# Patient Record
Sex: Female | Born: 1945 | Race: White | Hispanic: No | Marital: Married | Smoking: Never smoker
Health system: Southern US, Community
[De-identification: ages and names within clinical notes are randomized; demographics above are authoritative.]

## PROBLEM LIST (undated history)

## (undated) DIAGNOSIS — M797 Fibromyalgia: Secondary | ICD-10-CM

## (undated) DIAGNOSIS — M1990 Unspecified osteoarthritis, unspecified site: Secondary | ICD-10-CM

## (undated) DIAGNOSIS — G4700 Insomnia, unspecified: Secondary | ICD-10-CM

## (undated) DIAGNOSIS — E1140 Type 2 diabetes mellitus with diabetic neuropathy, unspecified: Secondary | ICD-10-CM

## (undated) DIAGNOSIS — E039 Hypothyroidism, unspecified: Secondary | ICD-10-CM

## (undated) DIAGNOSIS — E119 Type 2 diabetes mellitus without complications: Secondary | ICD-10-CM

## (undated) DIAGNOSIS — E079 Disorder of thyroid, unspecified: Secondary | ICD-10-CM

## (undated) HISTORY — PX: TONSILLECTOMY: SUR1361

## (undated) HISTORY — PX: WRIST SURGERY: SHX841

## (undated) HISTORY — PX: OTHER SURGICAL HISTORY: SHX169

## (undated) HISTORY — PX: APPENDECTOMY: SHX54

---

## 2014-11-29 ENCOUNTER — Emergency Department: Admit: 2014-11-29 | Payer: MEDICARE | Primary: Internal Medicine

## 2014-11-29 ENCOUNTER — Inpatient Hospital Stay
Admit: 2014-11-29 | Discharge: 2014-11-29 | Disposition: A | Discharge status: Home / Self Care | Payer: MEDICARE | Attending: Emergency Medicine

## 2014-11-29 DIAGNOSIS — M25511 Pain in right shoulder: Secondary | ICD-10-CM

## 2014-11-29 LAB — CBC WITH AUTO DIFFERENTIAL
Basophils %: 0.2 % (ref 0.0–1.0)
Basophils Absolute: 0 10*3/uL (ref 0.00–0.20)
Eosinophils %: 0.8 % (ref 0.0–5.0)
Eosinophils Absolute: 0.1 10*3/uL (ref 0.00–0.60)
Hematocrit: 43.4 % (ref 37.0–47.0)
Hemoglobin: 14.3 g/dL (ref 12.0–16.0)
Lymphocytes %: 24.8 % (ref 20.0–40.0)
Lymphocytes Absolute: 3.3 10*3/uL (ref 1.1–4.5)
MCH: 31.8 pg — ABNORMAL HIGH (ref 27.0–31.0)
MCHC: 32.9 g/dL — ABNORMAL LOW (ref 33.0–37.0)
MCV: 96.7 fL (ref 81.0–99.0)
MPV: 9.8 fL (ref 7.4–10.4)
Monocytes %: 10 % (ref 0.0–10.0)
Monocytes Absolute: 1.3 10*3/uL — ABNORMAL HIGH (ref 0.00–0.90)
Neutrophils %: 64.2 % (ref 50.0–65.0)
Neutrophils Absolute: 8.5 10*3/uL — ABNORMAL HIGH (ref 1.5–7.5)
Platelets: 352 10*3/uL (ref 130–400)
RBC: 4.49 M/uL (ref 4.20–5.40)
RDW: 12.8 % (ref 11.5–14.5)
WBC: 13.2 10*3/uL — ABNORMAL HIGH (ref 4.8–10.8)

## 2014-11-29 LAB — BASIC METABOLIC PANEL
Anion Gap: 15 mmol/L (ref 7–19)
BUN: 13 mg/dL (ref 8–23)
CO2: 30 mmol/L — ABNORMAL HIGH (ref 22–29)
Calcium: 10.6 mg/dL — ABNORMAL HIGH (ref 8.8–10.2)
Chloride: 86 mmol/L — ABNORMAL LOW (ref 98–111)
Creatinine: 0.6 mg/dL (ref 0.5–0.9)
GFR Non-African American: >60 (ref >60)
Glucose: 175 mg/dL — ABNORMAL HIGH (ref 74–109)
Potassium: 3.8 mmol/L (ref 3.5–5.0)
Sodium: 131 mmol/L — ABNORMAL LOW (ref 136–145)

## 2014-11-29 LAB — TROPONIN: Troponin: <0.01 ng/mL (ref 0.00–0.03)

## 2014-11-29 MED ORDER — HYDROMORPHONE HCL 1 MG/ML IJ SOLN
1 MG/ML | Freq: Once | INTRAMUSCULAR | Status: DC
Start: 2014-11-29 — End: 2014-11-29

## 2014-11-29 MED ORDER — ONDANSETRON HCL 4 MG/2ML IJ SOLN
4 MG/2ML | INTRAMUSCULAR | Status: AC
Start: 2014-11-29 — End: 2014-11-29
  Administered 2014-11-29: 20:00:00 4

## 2014-11-29 MED ORDER — METHOCARBAMOL 750 MG PO TABS
750 MG | ORAL_TABLET | Freq: Three times a day (TID) | ORAL | 0 refills | Status: AC
Start: 2014-11-29 — End: 2014-12-09

## 2014-11-29 MED ORDER — CYCLOBENZAPRINE HCL 10 MG PO TABS
10 MG | ORAL_TABLET | Freq: Three times a day (TID) | ORAL | 0 refills | Status: AC | PRN
Start: 2014-11-29 — End: ?

## 2014-11-29 MED ORDER — ONDANSETRON HCL 4 MG/2ML IJ SOLN
4 MG/2ML | Freq: Once | INTRAMUSCULAR | Status: DC
Start: 2014-11-29 — End: 2014-11-29

## 2014-11-29 MED ORDER — NAPROXEN 500 MG PO TABS
500 MG | ORAL_TABLET | Freq: Two times a day (BID) | ORAL | 0 refills | Status: AC
Start: 2014-11-29 — End: ?

## 2014-11-29 MED ORDER — HYDROMORPHONE HCL 1 MG/ML IJ SOLN
1 MG/ML | Freq: Once | INTRAMUSCULAR | Status: AC
Start: 2014-11-29 — End: 2014-11-29
  Administered 2014-11-29: 20:00:00 1 mg via INTRAVENOUS

## 2014-11-29 MED FILL — HYDROMORPHONE HCL 1 MG/ML IJ SOLN: 1 MG/ML | INTRAMUSCULAR | Qty: 1

## 2014-11-29 MED FILL — ONDANSETRON HCL 4 MG/2ML IJ SOLN: 4 MG/2ML | INTRAMUSCULAR | Qty: 2

## 2014-11-29 NOTE — ED Notes (Signed)
Call to lab to draw blood.     Marletta LorStacy M Young, RN  11/29/14 1435

## 2014-11-29 NOTE — ED Provider Notes (Signed)
[facility] EMERG[facility]Y DEPT  eMERGENCY dEPARTMENT eNCOUnter      Pt Name: Roberta Fernandez  MRN: [id number]  Birthdate [birth year]  Date of evaluation: 11/29/2014  Provider: Thurmon Fair, MD    CHIEF COMPLAINT       Chief Complaint   Patient presents with   ??? Shoulder Pain     onset several days ago.  denies any injury         HISTORY OF PRESENT ILLNESS   (Location/Symptom, Timing/Onset, Context/Setting, Quality, Duration, Modifying Factors, Severity)  Note limiting factors.   Roberta Fernandez is a 69 y.o. female who presents to the emergency department With right shoulder and arm pain. The patient states that it's been going on for the last few days. She describes as a sharp and stabbing pain. It is worse with movement. She says she also has pain in the middle of her back when she takes a deep breath. She says she has numbness in her right thumb when she straightens out her arm. The patient has a history of fibromyalgia, but she says this pain is different. She denies any injury. She's had no definite fever, but she says that when she was sleeping the other night she had to use a cover because she felt like she might have a temperature. Denies any history of any degenerative disc disease in her neck or any other neck injury. She says she does have pain when she turns her head to side to side. She's pulled on her shoulder when she turns her neck. The location of her pain depends on what part of her body is moving. She denies any shortness of breath or definite chest pain.no calf pain or leg swelling. No history of blood clots.    HPI    Nursing Notes were reviewed.    REVIEW OF SYSTEMS    (2-9 systems for level 4, 10 or more for level 5)     Review of Systems   Constitutional: Negative for chills and fever.   HENT: Negative for rhinorrhea and sore throat.    Respiratory: Negative for shortness of breath.    Cardiovascular: Negative for chest pain and leg swelling.   Gastrointestinal: Negative for abdominal pain, diarrhea, nausea  and vomiting.   Genitourinary: Negative for difficulty urinating.   Musculoskeletal: Positive for back pain. Negative for neck pain.        Right shoulder and arm pain   Skin: Negative for rash.   Neurological: Negative for weakness and headaches.   Psychiatric/Behavioral: Negative for confusion.       A complete review of systems was performed and is negative except as noted above in the HPI.       PAST MEDICAL HISTORY     Past Medical History   Diagnosis Date   ??? Anxiety    ??? Arthritis    ??? Chronic fatigue    ??? Diabetes mellitus (HCC)    ??? Fibromyalgia    ??? Hyperlipidemia    ??? Sleep apnea    ??? Thyroid disease          SURGICAL HISTORY     History reviewed. No pertinent past surgical history.      CURRENT MEDICATIONS       Previous Medications    ALENDRONATE (FOSAMAX) 70 MG TABLET    Take 70 mg by mouth every 7 days    ALPRAZOLAM (XANAX) 0.5 MG TABLET    Take 0.5 mg by mouth nightly as needed for Sleep  AMITRIPTYLINE (ELAVIL) 100 MG TABLET    Take 100 mg by mouth nightly    EXENATIDE (BYETTA) 10 MCG/0.04ML INJECTION    Inject 5 mcg into the skin 2 times daily (with meals)    FLUTICASONE (FLONASE) 50 MCG/ACT NASAL SPRAY    1 spray by Nasal route daily    LEVOTHYROXINE (SYNTHROID) 25 MCG TABLET    Take 25 mcg by mouth Daily    METFORMIN (GLUCOPHAGE) 500 MG TABLET    Take 500 mg by mouth 2 times daily (with meals)    OMEPRAZOLE (PRILOSEC) 20 MG CAPSULE    Take 20 mg by mouth daily    SIMVASTATIN (ZOCOR) 10 MG TABLET    Take 10 mg by mouth nightly    TRIAMTERENE-HYDROCHLOROTHIAZIDE (MAXZIDE-25) 37.5-25 MG PER TABLET    Take 1 tablet by mouth daily       ALLERGIES     Codeine; Levaquin [levofloxacin in d5w]; Other; Penicillins; and Sulfa antibiotics    FAMILY HISTORY     History reviewed. No pertinent family history.       SOCIAL HISTORY       Social History     Social History   ??? Marital status: Married     Spouse name: N/A   ??? Number of children: N/A   ??? Years of education: N/A     Social History Main Topics   ???  Smoking status: Never Smoker   ??? Smokeless tobacco: None   ??? Alcohol use Yes   ??? Drug use: No   ??? Sexual activity: Not Asked     Other Topics Concern   ??? None     Social History Narrative   ??? None       SCREENINGS             PHYSICAL EXAM    (up to 7 for level 4, 8 or more for level 5)   ED Triage Vitals   BP Temp Temp src Pulse Resp SpO2 Height Weight   11/29/14 1314 -- -- 11/29/14 1314 11/29/14 1314 11/29/14 1314 11/29/14 1250 11/29/14 1250   141/86   106 17 98 %  (1.549 m) 176 lb (79.8 kg)       Physical Exam   Constitutional: She is oriented to person, place, and time. She appears well-developed and well-nourished. No distress.   HENT:   Head: Normocephalic and atraumatic.   Eyes: Pupils are equal, round, and reactive to light.   Neck: Normal range of motion. Neck supple.   Cardiovascular: Normal rate, regular rhythm, normal heart sounds and intact distal pulses.    Pulmonary/Chest: Effort normal and breath sounds normal. No respiratory distress. She exhibits no tenderness.   Abdominal: Soft. Bowel sounds are normal. She exhibits no distension. There is no tenderness.   Musculoskeletal: Normal range of motion. She exhibits no edema.   reproduction of pain with palpation of right soft tissues of her neck and upper back and movement of arm   Neurological: She is alert and oriented to person, place, and time. No cranial nerve deficit. She exhibits normal muscle tone. Coordination normal.   Skin: Skin is warm and dry. No rash noted. She is not diaphoretic.   Psychiatric: She has a normal mood and affect. Her behavior is normal.       DIAGNOSTIC RESULTS     EKG: All EKG's are interpreted by the Emergency Department Physician who either signs or Co-signs this chart in the absence of a cardiologist.  sinus tach rate 109 nonspecific ST-T wave changes    RADIOLOGY:   Non-plain film images such as CT, Ultrasound and MRI are read by the radiologist. Plain radiographic images are visualized and preliminarily  interpreted by the emergency physician with the below findings:      Interpretation per the Radiologist below, if available at the time of this note:    XR Shoulder Right Standard   Final Result   Impression: No acute findings. Degenerative change.      Dictated on 11/29/2014 2:54 PM EST. Signed by Dr Donnalee Curry on   11/29/2014 2:56 PM EST   Signed by Dr Donnalee Curry  on 11/29/2014 13:56      XR Chest Standard TWO VW   Final Result   Impression:    No acute findings.      Dictated on 11/29/2014 2:53 PM EST. Signed by Dr Donnalee Curry on   11/29/2014 2:54 PM EST   Signed by Dr Donnalee Curry  on 11/29/2014 13:54            ED BEDSIDE ULTRASOUND:   Performed by ED Physician - none    LABS:  Labs Reviewed   CBC WITH AUTO DIFFERENTIAL - Abnormal; Notable for the following:     WBC 13.2 (*)     MCH 31.8 (*)     MCHC 32.9 (*)     Neutrophils Absolute 8.5 (*)     Monocytes Absolute 1.30 (*)     All other components within normal limits   BASIC METABOLIC PANEL - Abnormal; Notable for the following:     Sodium 131 (*)     Chloride 86 (*)     CO2 30 (*)     Glucose 175 (*)     Calcium 10.6 (*)     All other components within normal limits   TROPONIN   POCT TROPONIN       All other labs were within normal range or not returned as of this dictation.    EMERGENCY DEPARTMENT COURSE and DIFFERENTIAL DIAGNOSIS/MDM:   Vitals:    Vitals:    11/29/14 1250 11/29/14 1314   BP:  141/86   Pulse:  106   Resp:  17   SpO2:  98%   Weight: 176 lb (79.8 kg)    Height: 5\' 1"  (1.549 m)        MDM  I really feel like this a musculokeletal pain.  I considered cardiac etiology, pneumonia, ptx, pe in my differential. Pain is reproducible with movement and with palpation. Pt denies SOA, she just says her back hurts when she takes a deep breath, likely from chest wall expansion as lungs expand. She has no HO PE, no signs of DVT, and normal oxygen saturation   Pt's sodium level is slightly low. Pt is aware, states she drinks copious amounts of water.      CONSULTS:  None    PROCEDURES:  Unless otherwise noted below, none     Procedures    FINAL IMPRESSION      1. Acute pain of right shoulder    2. Hyponatremia          DISPOSITION/PLAN   DISPOSITION Decision to Discharge    PATIENT REFERRED TO:  Domingo Madeira  [address]  [address]  [place] [address]  [phone]    In 2 days        DISCHARGE MEDICATIONS:  New Prescriptions    NAPROXEN (NAPROSYN) 500 MG  TABLET    Take 1 tablet by mouth 2 times daily          (Please note that portions of this note were completed with a voice recognition program.  Efforts were made to edit the dictations but occasionally words are mis-transcribed.)    Thurmon Fair, MD (electronically signed)  Attending Emergency Physician         Thurmon Fair, MD  11/29/14 [phone]

## 2014-11-29 NOTE — Discharge Instructions (Signed)
Return    Musculoskeletal Pain: Care Instructions  Your Care Instructions  Different problems with the bones, muscles, nerves, ligaments, and tendons in the body can cause pain. One or more areas of your body may ache or burn. Or they may feel tired, stiff, or sore.  The medical term for this type of pain is musculoskeletal pain. It can have many different causes.  Sometimes the pain is caused by an injury such as a strain or sprain. Or you might have pain from using one part of your body in the same way over and over again. This is called overuse.  In some cases, the cause of the pain is another health problem such as arthritis or fibromyalgia.  The doctor will examine you and ask you questions about your health to help find the cause of your pain. Blood tests or imaging tests like an X-ray may also be helpful. But sometimes doctors can't find a cause of the pain.  Treatment depends on your symptoms and the cause of the pain, if known.  The doctor has checked you carefully, but problems can develop later. If you notice any problems or new symptoms, get medical treatment right away.  Follow-up care is a key part of your treatment and safety. Be sure to make and go to all appointments, and call your doctor if you are having problems. It's also a good idea to know your test results and keep a list of the medicines you take.  How can you care for yourself at home?   Rest until you feel better.   Do not do anything that makes the pain worse. Return to exercise gradually if you feel better and your doctor says it's okay.   Be safe with medicines. Read and follow all instructions on the label.   If the doctor gave you a prescription medicine for pain, take it as prescribed.   If you are not taking a prescription pain medicine, ask your doctor if you can take an over-the-counter medicine.   Put ice or a cold pack on the area for 10 to 20 minutes at a time to ease pain. Put a thin cloth between the ice and your  skin.  When should you call for help?  Call your doctor now or seek immediate medical care if:   You have new pain, or your pain gets worse.   You have new symptoms such as a fever, a rash, or chills.  Watch closely for changes in your health, and be sure to contact your doctor if:   You do not get better as expected.   Where can you learn more?   Go to [email or web address] and sign in to your MyChart account. Enter [phone] in the Search Health Information box to learn more about "Musculoskeletal Pain: Care Instructions."    If you do not have an account, please click on the "Sign Up Now" link.    2006-2016 [employer]. Care instructions adapted under license by [facility]. This care instruction is for use with your licensed healthcare professional. If you have questions about a medical condition or this instruction, always ask your healthcare professional. [employer] disclaims any warranty or liability for your use of this information.  Content Version: 10.8.513193; Current as of: February 28, 2014          Shoulder Pain: Care Instructions  Your Care Instructions     You can hurt your shoulder by using it too much during an activity, such as  fishing or baseball. It can also happen as part of the everyday wear and tear of getting older. Shoulder injuries can be slow to heal, but your shoulder should get better with time.  Your doctor may recommend a sling to rest your shoulder. If you have injured your shoulder, you may need testing and treatment.  Follow-up care is a key part of your treatment and safety. Be sure to make and go to all appointments, and call your doctor if you are having problems. It's also a good idea to know your test results and keep a list of the medicines you take.  How can you care for yourself at home?   Take pain medicines exactly as directed.   If the doctor gave you a prescription medicine for pain, take it as prescribed.   If you are not  taking a prescription pain medicine, ask your doctor if you can take an over-the-counter medicine.   Do not take two or more pain medicines at the same time unless the doctor told you to. Many pain medicines contain acetaminophen, which is Tylenol. Too much acetaminophen (Tylenol) can be harmful.   If your doctor recommends that you wear a sling, use it as directed. Do not take it off before your doctor tells you to.   Put ice or a cold pack on the sore area for 10 to 20 minutes at a time. Put a thin cloth between the ice and your skin.   If there is no swelling, you can put moist heat, a heating pad, or a warm cloth on your shoulder. Some doctors suggest alternating between hot and cold.   Rest your shoulder for a few days. If your doctor recommends it, you can then begin gentle exercise of the shoulder, but do not lift anything heavy.  When should you call for help?  Call 911 anytime you think you may need emergency care. For example, call if:   You have chest pain or pressure. This may occur with:   Sweating.   Shortness of breath.   Nausea or vomiting.   Pain that spreads from the chest to the neck, jaw, or one or both shoulders or arms.   Dizziness or lightheadedness.   A fast or uneven pulse.  After calling 911, chew 1 adult-strength aspirin. Wait for an ambulance. Do not try to drive yourself.   Your arm or hand is cool or pale or changes color.  Call your doctor now or seek immediate medical care if:   You have signs of infection, such as:   Increased pain, swelling, warmth, or redness in your shoulder.   Red streaks leading from a place on your shoulder.   Pus draining from an area of your shoulder.   Swollen lymph nodes in your neck, armpits, or groin.   A fever.  Watch closely for changes in your health, and be sure to contact your doctor if:   You cannot use your shoulder.   Your shoulder does not get better as expected.   Where can you learn more?   Go to  [email or web address] and sign in to your MyChart account. Enter [phone] in the Search Health Information box to learn more about "Shoulder Pain: Care Instructions."    If you do not have an account, please click on the "Sign Up Now" link.    2006-2016 [employer]. Care instructions adapted under license by [facility]. This care instruction is for use with your licensed healthcare professional. If you  have questions about a medical condition or this instruction, always ask your healthcare professional. [employer] disclaims any warranty or liability for your use of this information.  Content Version: 10.8.513193; Current as of: Aug 30, 2013       to ER for worsening chest pain, shortness of breath

## 2014-12-17 NOTE — Procedures (Signed)
EKG    DATE OF PROCEDURE:  11/29/2014      Time: 1256 hours     Leftward axis.  No prior tracing is available for comparison.         ________________________________  Shela Commons. Despina Pole, MD    [phone]  DD: 12/11/2014 16:14  DT: 12/17/2014 09:50  [facility] File#: [id number]  Job #:[id number] /  [id number]    cc:

## 2016-11-10 DIAGNOSIS — E039 Hypothyroidism, unspecified: Secondary | ICD-10-CM | POA: Insufficient documentation

## 2016-11-10 DIAGNOSIS — M1990 Unspecified osteoarthritis, unspecified site: Secondary | ICD-10-CM | POA: Insufficient documentation

## 2016-11-10 DIAGNOSIS — F419 Anxiety disorder, unspecified: Secondary | ICD-10-CM | POA: Insufficient documentation

## 2016-11-10 DIAGNOSIS — M797 Fibromyalgia: Secondary | ICD-10-CM | POA: Insufficient documentation

## 2016-11-10 DIAGNOSIS — K219 Gastro-esophageal reflux disease without esophagitis: Secondary | ICD-10-CM | POA: Insufficient documentation

## 2018-05-28 DIAGNOSIS — E7800 Pure hypercholesterolemia, unspecified: Secondary | ICD-10-CM | POA: Insufficient documentation

## 2018-05-28 DIAGNOSIS — F909 Attention-deficit hyperactivity disorder, unspecified type: Secondary | ICD-10-CM | POA: Insufficient documentation

## 2018-09-24 DIAGNOSIS — E1142 Type 2 diabetes mellitus with diabetic polyneuropathy: Secondary | ICD-10-CM | POA: Insufficient documentation

## 2019-06-23 ENCOUNTER — Emergency Department: Payer: Medicare Other

## 2019-06-23 ENCOUNTER — Encounter: Payer: Self-pay | Admitting: Emergency Medicine

## 2019-06-23 ENCOUNTER — Other Ambulatory Visit: Payer: Self-pay

## 2019-06-23 ENCOUNTER — Emergency Department
Admission: EM | Admit: 2019-06-23 | Discharge: 2019-06-23 | Disposition: A | Discharge status: Home / Self Care | Payer: Medicare Other | Attending: Emergency Medicine | Admitting: Emergency Medicine

## 2019-06-23 DIAGNOSIS — E119 Type 2 diabetes mellitus without complications: Secondary | ICD-10-CM | POA: Diagnosis not present

## 2019-06-23 DIAGNOSIS — R42 Dizziness and giddiness: Secondary | ICD-10-CM | POA: Insufficient documentation

## 2019-06-23 DIAGNOSIS — R531 Weakness: Secondary | ICD-10-CM | POA: Diagnosis not present

## 2019-06-23 HISTORY — DX: Disorder of thyroid, unspecified: E07.9

## 2019-06-23 HISTORY — DX: Unspecified osteoarthritis, unspecified site: M19.90

## 2019-06-23 HISTORY — DX: Type 2 diabetes mellitus without complications: E11.9

## 2019-06-23 LAB — BASIC METABOLIC PANEL
Anion gap: 11 (ref 5–15)
BUN: 21 mg/dL (ref 8–23)
CO2: 28 mmol/L (ref 22–32)
Calcium: 9.9 mg/dL (ref 8.9–10.3)
Chloride: 94 mmol/L — ABNORMAL LOW (ref 98–111)
Creatinine, Ser: 0.94 mg/dL (ref 0.44–1.00)
GFR calc Af Amer: >60 mL/min (ref >60)
GFR calc non Af Amer: 60 mL/min — ABNORMAL LOW (ref >60)
Glucose, Bld: 184 mg/dL — ABNORMAL HIGH (ref 70–99)
Potassium: 4.3 mmol/L (ref 3.5–5.1)
Sodium: 133 mmol/L — ABNORMAL LOW (ref 135–145)

## 2019-06-23 LAB — URINALYSIS, COMPLETE (UACMP) WITH MICROSCOPIC
Bacteria, UA: NONE SEEN
Bilirubin Urine: NEGATIVE
Glucose, UA: NEGATIVE mg/dL
Hgb urine dipstick: NEGATIVE
Ketones, ur: NEGATIVE mg/dL
Leukocytes,Ua: NEGATIVE
Nitrite: NEGATIVE
Protein, ur: NEGATIVE mg/dL
Specific Gravity, Urine: 1.003 — ABNORMAL LOW (ref 1.005–1.030)
Squamous Epithelial / HPF: NONE SEEN (ref 0–5)
pH: 8 (ref 5.0–8.0)

## 2019-06-23 LAB — CBC
HCT: 43.2 % (ref 36.0–46.0)
Hemoglobin: 13.8 g/dL (ref 12.0–15.0)
MCH: 30.1 pg (ref 26.0–34.0)
MCHC: 31.9 g/dL (ref 30.0–36.0)
MCV: 94.3 fL (ref 80.0–100.0)
Platelets: 342 10*3/uL (ref 150–400)
RBC: 4.58 MIL/uL (ref 3.87–5.11)
RDW: 12.3 % (ref 11.5–15.5)
WBC: 11 10*3/uL — ABNORMAL HIGH (ref 4.0–10.5)
nRBC: 0 % (ref 0.0–0.2)

## 2019-06-23 LAB — TSH: TSH: 0.8 u[IU]/mL (ref 0.350–4.500)

## 2019-06-23 MED ORDER — LORAZEPAM 2 MG/ML IJ SOLN
1.0000 mg | Freq: Once | INTRAMUSCULAR | Status: AC
  Administered 2019-06-23: 1 mg via INTRAVENOUS
  Filled 2019-06-23: qty 1

## 2019-06-23 NOTE — Discharge Instructions (Addendum)
I suspect your symptoms are originating from your inner ear.  Considerations includes:  Vestibular neuropathy Diabetic neuropathy causing above Possible relation to your chronic sinusitis with middle ear effusions Meniere's disease Less likely BPPV (benign positional paroxysmal vertigo)  I'd recommend following up with an ENT specialist (ideally middle/inner ear) in the next 1-2 weeks  Try to take an extra valium with symptom onset to see if it helps  Try sinus rinses as well if able

## 2019-06-23 NOTE — ED Triage Notes (Signed)
Pt arrived via POV from [facility], pt states she was sent here for possible stroke sxs.  Pt reports of "profound dry mouth" since Wednesday, causing difficulty speaking.  Pt also c/o dizziness when turning her head.  Pt states she was told that she had a rare condition they way her eyes moved.   Pt was told CBG was 201 at [facility].  Pt recently started on Trulicity.

## 2019-06-23 NOTE — ED Notes (Signed)
Pt taken to mri via mri tech

## 2019-06-23 NOTE — ED Provider Notes (Signed)
[facility] Emergency Department Provider Note  ____________________________________________   First MD Initiated Contact with Patient 06/23/19 1035     (approximate)  I have reviewed the triage vital signs and the nursing notes.   HISTORY  Chief Complaint Weakness    HPI Ruth Stout is a 74 y.o. female  Here with multiple issues. Primary complaint is dry mouth, loss of balance, and intermittent dizziness. This is an ongoing, chronic issue for months to years. She has had extensive work-ups for this. She states that she is here today because over the past week she has felt like she has had more frequency episodes, that she had an episode of "leaning left," and loss of balance last week. She has had some dry mouth, fatigue, and polyuria with this as well. Her sugars have been slightly but not severely elevated. 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 No headache. No fever, chills. No recent medication changes. No tinnitus.       Past Medical History:  Diagnosis Date  . Arthritis   . Diabetes mellitus without complication ([place])   . Thyroid disease     There are no problems to display for this patient.   History reviewed. No pertinent surgical history.  Prior to Admission medications   Not on File    Allergies Cephalexin, Codeine, Levofloxacin in d5w, Other, Penicillins, and Sulfa antibiotics  No family history on file.  Social History Social History   Tobacco Use  . Smoking status: Never Smoker  . Smokeless tobacco: Never Used  Substance Use Topics  . Alcohol use: Not on file  . Drug use: Not on file    Review of Systems  Review of Systems  Constitutional: Positive for fatigue. Negative for fever.  HENT: Negative for congestion and sore throat.   Eyes: Negative for visual disturbance.    Respiratory: Negative for cough and shortness of breath.   Cardiovascular: Negative for chest pain.  Gastrointestinal: Negative for abdominal pain, diarrhea, nausea and vomiting.  Endocrine: Positive for polydipsia and polyuria.  Genitourinary: Negative for flank pain.  Musculoskeletal: Positive for gait problem. Negative for back pain and neck pain.  Skin: Negative for rash and wound.  Neurological: Positive for dizziness. Negative for weakness.  All other systems reviewed and are negative.    ____________________________________________  PHYSICAL EXAM:      VITAL SIGNS: ED Triage Vitals [06/23/19 1009]  Enc Vitals Group     BP 138/71     Pulse Rate 89     Resp 18     Temp 98.8 F (37.1 C)     Temp Source Oral     SpO2 97 %     Weight 180 lb (81.6 kg)     Height 5' 1"  (1.549 m)     Head Circumference      Peak Flow      Pain Score 0     Pain Loc      Pain Edu?      Excl. in [place]?      Physical Exam Vitals and nursing note reviewed.  Constitutional:      General: She is not in acute distress.    Appearance: She is well-developed.  HENT:     Head: Normocephalic and atraumatic.     Comments: Moderate serous effusions bilaterally.  No TM erythema.    Mouth/Throat:     Mouth: Mucous membranes are dry.  Eyes:     Conjunctiva/sclera: Conjunctivae normal.  Cardiovascular:     Rate and Rhythm: Normal rate and regular rhythm.     Heart sounds: Normal heart sounds.  Pulmonary:     Effort: Pulmonary effort is normal. No respiratory distress.     Breath sounds: No wheezing.  Abdominal:     General: There is no distension.  Musculoskeletal:     Cervical back: Neck supple.  Skin:    General: Skin is warm.     Capillary Refill: Capillary refill takes less than 2 seconds.     Findings: No rash.  Neurological:     Mental Status: She is alert and oriented to person, place, and time.     Motor: No abnormal muscle tone.     Comments: Neurological Exam:  Mental Status:  Alert and oriented to person, place, and time. Attention and concentration normal. Speech clear. Recent memory is intact. Cranial Nerves: Visual fields grossly intact. EOMI and PERRLA. No nystagmus noted. Facial sensation intact at forehead, maxillary cheek, and chin/mandible bilaterally. No facial asymmetry or weakness. Hearing grossly normal. Uvula is midline, and palate elevates symmetrically. Normal SCM and trapezius strength. Tongue midline without fasciculations. Motor: Muscle strength 5/5 in proximal and distal UE and LE bilaterally. No pronator drift. Muscle tone normal.  Sensation: Intact to light touch in upper and lower extremities distally bilaterally.  Gait: Normal without ataxia. Coordination: Normal FTN bilaterally.          ____________________________________________   LABS (all labs ordered are listed, but only abnormal results are displayed)  Labs Reviewed  BASIC METABOLIC PANEL - Abnormal; Notable for the following components:      Result Value   Sodium 133 (*)    Chloride 94 (*)    Glucose, Bld 184 (*)    GFR calc non Af Amer 60 (*)    All other components within normal limits  CBC - Abnormal; Notable for the following components:   WBC 11.0 (*)    All other components within normal limits  URINALYSIS, COMPLETE (UACMP) WITH MICROSCOPIC - Abnormal; Notable for the following components:   Color, Urine STRAW (*)    APPearance CLEAR (*)    Specific Gravity, Urine 1.003 (*)    All other components within normal limits  TSH  BASIC METABOLIC PANEL    ____________________________________________  EKG: None ________________________________________  RADIOLOGY All imaging, including plain films, CT scans, and ultrasounds, independently reviewed by me, and interpretations confirmed via formal radiology reads.  ED MD interpretation:   MRI: No acute abnormality or mass  Official radiology report(s): MR BRAIN WO CONTRAST  Result Date: 06/23/2019 CLINICAL  DATA:  Acute stroke suspected. Dry mouth since Wednesday with difficulty speaking. Dizziness when turning head. EXAM: MRI HEAD WITHOUT CONTRAST TECHNIQUE: Multiplanar, multiecho pulse sequences of the brain and surrounding structures were obtained without intravenous contrast. COMPARISON:  None. FINDINGS: Brain: Chronic small vessel ischemic type change in the cerebral white matter to a mild degree. Age normal brain volume. No acute infarct, hemorrhage, hydrocephalus, or masslike finding. Vascular: Normal flow voids. Skull and upper cervical spine: Normal marrow signal. Cervical spine degeneration. Sinuses/Orbits: Mucosal thickening in the anterior ethmoid and inferior frontal sinuses, incidental to the history. Mastoid and middle ear spaces are clear. Negative orbits. IMPRESSION: 1. No acute finding, including infarct. 2. Senescent changes in keeping with age. 3. Mucosal thickening in the frontal and  ethmoid sinuses. Electronically Signed   By: Monte Fantasia M.D.   On: 06/23/2019 13:10    ____________________________________________  PROCEDURES   Procedure(s) performed (including Critical Care):  Procedures  ____________________________________________  INITIAL IMPRESSION / MDM / [place] / ED COURSE  As part of my medical decision making, I reviewed the following data within the [facility] notes reviewed and incorporated, Old chart reviewed, Notes from prior ED visits, and [place] Controlled Substance Database       *GREYSON RICCARDI was evaluated in Emergency Department on 06/23/2019 for the symptoms described in the history of present illness. She was evaluated in the context of the global COVID-19 pandemic, which necessitated consideration that the patient might be at risk for infection with the SARS-CoV-2 virus that causes COVID-19. Institutional protocols and algorithms that pertain to the evaluation of patients at risk for COVID-19 are in a state of rapid  change based on information released by regulatory bodies including the CDC and federal and state organizations. These policies and algorithms were followed during the patient's care in the ED.  Some ED evaluations and interventions may be delayed as a result of limited staffing during the pandemic.*     Medical Decision Making:  74 yo F here with intermittent dizziness, loss of balance. This seems to be an ongoing issue, but given her age must consider CVA. MRI obtained which is fortunately negative. No signs of pituitary or other mass/lesion. Labs overall reassuring. She has an extensive h/o middle and inner ear issues and I suspect this is contributing to her sx. She c/o dry mouth as well and has h/o poorly controlled DM. No signs of DKA and she is tolerating PO, but she very well could have a diabetic neuropathy /vestibulopathy as well. WIll refer her to ENT. No apparent emergent etiology.  ____________________________________________  FINAL CLINICAL IMPRESSION(S) / ED DIAGNOSES  Final diagnoses:  Vertigo     MEDICATIONS GIVEN DURING THIS VISIT:  Medications  LORazepam (ATIVAN) injection 1 mg (1 mg Intravenous Given 06/23/19 1223)     ED Discharge Orders    None       Note:  This document was prepared using Dragon voice recognition software and may include unintentional dictation errors.   Duffy Bruce, MD 06/23/19 1757

## 2020-05-02 ENCOUNTER — Emergency Department
Admission: EM | Admit: 2020-05-02 | Discharge: 2020-05-02 | Disposition: A | Discharge status: Home / Self Care | Payer: Medicare Other | Attending: Emergency Medicine | Admitting: Emergency Medicine

## 2020-05-02 ENCOUNTER — Encounter: Payer: Self-pay | Admitting: Intensive Care

## 2020-05-02 ENCOUNTER — Emergency Department: Payer: Medicare Other

## 2020-05-02 ENCOUNTER — Other Ambulatory Visit: Payer: Self-pay

## 2020-05-02 DIAGNOSIS — R600 Localized edema: Secondary | ICD-10-CM | POA: Insufficient documentation

## 2020-05-02 DIAGNOSIS — Z5321 Procedure and treatment not carried out due to patient leaving prior to being seen by health care provider: Secondary | ICD-10-CM | POA: Diagnosis not present

## 2020-05-02 DIAGNOSIS — M79672 Pain in left foot: Secondary | ICD-10-CM | POA: Diagnosis not present

## 2020-05-02 DIAGNOSIS — E119 Type 2 diabetes mellitus without complications: Secondary | ICD-10-CM | POA: Diagnosis not present

## 2020-05-02 HISTORY — DX: Hypothyroidism, unspecified: E03.9

## 2020-05-02 HISTORY — DX: Insomnia, unspecified: G47.00

## 2020-05-02 HISTORY — DX: Fibromyalgia: M79.7

## 2020-05-02 HISTORY — DX: Type 2 diabetes mellitus with diabetic neuropathy, unspecified: E11.40

## 2020-05-02 LAB — CBC WITH DIFFERENTIAL/PLATELET
Abs Immature Granulocytes: 0.07 10*3/uL (ref 0.00–0.07)
Basophils Absolute: 0 10*3/uL (ref 0.0–0.1)
Basophils Relative: 0 %
Eosinophils Absolute: 0.1 10*3/uL (ref 0.0–0.5)
Eosinophils Relative: 1 %
HCT: 33.6 % — ABNORMAL LOW (ref 36.0–46.0)
Hemoglobin: 11.2 g/dL — ABNORMAL LOW (ref 12.0–15.0)
Immature Granulocytes: 1 %
Lymphocytes Relative: 33 %
Lymphs Abs: 4.5 10*3/uL — ABNORMAL HIGH (ref 0.7–4.0)
MCH: 30.4 pg (ref 26.0–34.0)
MCHC: 33.3 g/dL (ref 30.0–36.0)
MCV: 91.3 fL (ref 80.0–100.0)
Monocytes Absolute: 0.9 10*3/uL (ref 0.1–1.0)
Monocytes Relative: 7 %
Neutro Abs: 8.1 10*3/uL — ABNORMAL HIGH (ref 1.7–7.7)
Neutrophils Relative %: 58 %
Platelets: 349 10*3/uL (ref 150–400)
RBC: 3.68 MIL/uL — ABNORMAL LOW (ref 3.87–5.11)
RDW: 12 % (ref 11.5–15.5)
WBC: 13.7 10*3/uL — ABNORMAL HIGH (ref 4.0–10.5)
nRBC: 0 % (ref 0.0–0.2)

## 2020-05-02 LAB — COMPREHENSIVE METABOLIC PANEL
ALT: 14 U/L (ref 0–44)
AST: 18 U/L (ref 15–41)
Albumin: 3.3 g/dL — ABNORMAL LOW (ref 3.5–5.0)
Alkaline Phosphatase: 63 U/L (ref 38–126)
Anion gap: 10 (ref 5–15)
BUN: 16 mg/dL (ref 8–23)
CO2: 27 mmol/L (ref 22–32)
Calcium: 9 mg/dL (ref 8.9–10.3)
Chloride: 94 mmol/L — ABNORMAL LOW (ref 98–111)
Creatinine, Ser: 0.84 mg/dL (ref 0.44–1.00)
GFR, Estimated: >60 mL/min (ref >60)
Glucose, Bld: 327 mg/dL — ABNORMAL HIGH (ref 70–99)
Potassium: 3.4 mmol/L — ABNORMAL LOW (ref 3.5–5.1)
Sodium: 131 mmol/L — ABNORMAL LOW (ref 135–145)
Total Bilirubin: 0.6 mg/dL (ref 0.3–1.2)
Total Protein: 7.3 g/dL (ref 6.5–8.1)

## 2020-05-02 NOTE — ED Triage Notes (Signed)
Patient presents with left foot/calf swelling and pain X2 days. HX diabetes. No open wounds. No injury. Denies taking blood thinners

## 2020-05-05 ENCOUNTER — Emergency Department
Admission: EM | Admit: 2020-05-05 | Discharge: 2020-05-05 | Disposition: A | Discharge status: Home / Self Care | Payer: Medicare Other | Attending: Emergency Medicine | Admitting: Emergency Medicine

## 2020-05-05 ENCOUNTER — Emergency Department: Payer: Medicare Other

## 2020-05-05 DIAGNOSIS — R609 Edema, unspecified: Secondary | ICD-10-CM

## 2020-05-05 DIAGNOSIS — R600 Localized edema: Secondary | ICD-10-CM | POA: Diagnosis not present

## 2020-05-05 DIAGNOSIS — E1140 Type 2 diabetes mellitus with diabetic neuropathy, unspecified: Secondary | ICD-10-CM | POA: Insufficient documentation

## 2020-05-05 DIAGNOSIS — E039 Hypothyroidism, unspecified: Secondary | ICD-10-CM | POA: Insufficient documentation

## 2020-05-05 DIAGNOSIS — M79672 Pain in left foot: Secondary | ICD-10-CM | POA: Diagnosis present

## 2020-05-05 NOTE — ED Provider Notes (Signed)
[facility] Emergency Department Provider Note ____________________________________________  Time seen: Approximately 5:09 PM  I have reviewed the triage vital signs and the nursing notes.   HISTORY  Chief Complaint left foot swollen    HPI Ruth Stout is a 75 y.o. female who presents to the emergency department for evaluation and treatment of left foot pain and swelling.  Patient denies injury.  Symptoms started 4 days ago.  Pain increases with ambulation.  No alleviating measures attempted prior to arrival.   Past Medical History:  Diagnosis Date  . Arthritis   . Diabetes mellitus without complication ([place])   . Diabetic neuropathy ([place])   . Fibromyalgia   . Hypothyroid   . Insomnia   . Thyroid disease     There are no problems to display for this patient.   History reviewed. No pertinent surgical history.  Prior to Admission medications   Not on File    Allergies Cephalexin, Codeine, Levofloxacin in d5w, Other, Penicillins, and Sulfa antibiotics  No family history on file.  Social History Social History   Tobacco Use  . Smoking status: Never Smoker  . Smokeless tobacco: Never Used  Vaping Use  . Vaping Use: Never used  Substance Use Topics  . Alcohol use: Yes    Comment: occ  . Drug use: Yes    Comment: prescribed oxy    Review of Systems Constitutional: Negative for fever. Cardiovascular: Negative for chest pain. Respiratory: Negative for shortness of breath. Musculoskeletal: Positive for left foot pain. Skin: Positive for left foot swelling. Neurological: Negative for decrease in sensation  ____________________________________________   PHYSICAL EXAM:  VITAL SIGNS: ED Triage Vitals  Enc Vitals Group     BP 05/05/20 1624 117/66     Pulse Rate 05/05/20 1624 67     Resp 05/05/20 1624 18     Temp 05/05/20 1624 98 F (36.7 C)     Temp src --      SpO2 05/05/20 1624 100 %     Weight 05/05/20 1623 173 lb (78.5 kg)      Height 05/05/20 1623 5' 2"  (1.575 m)     Head Circumference --      Peak Flow --      Pain Score 05/05/20 1623 8     Pain Loc --      Pain Edu? --      Excl. in [place]? --     Constitutional: Alert and oriented. Well appearing and in no acute distress. Eyes: Conjunctivae are clear without discharge or drainage Head: Atraumatic Neck: Supple. Respiratory: No cough. Respirations are even and unlabored. Cardiovascular: DP and PT pulse 2+ in bilateral lower extremities.  1+ pitting edema noted in the left foot and ankle. Musculoskeletal: Able to demonstrate range of motion of the left foot and ankle. Neurologic: Motor and sensory function is intact. Skin: No erythema or increased skin temperature overlying the left foot or ankle Psychiatric: Affect and behavior are appropriate.  ____________________________________________   LABS (all labs ordered are listed, but only abnormal results are displayed)  Labs Reviewed - No data to display ____________________________________________  RADIOLOGY  Image of the left foot is negative for acute findings.  I, Sherrie George, personally viewed and evaluated these images (plain radiographs) as part of my medical decision making, as well as reviewing the written report by the radiologist.  DG Foot Complete Left  Result Date: 05/05/2020 CLINICAL DATA:  Pain and swelling EXAM: LEFT FOOT - COMPLETE 3+ VIEW COMPARISON:  None. FINDINGS: No fracture or malalignment. Small plantar calcaneal spur. Diffuse soft tissue swelling. Joint spaces appear maintained IMPRESSION: No acute osseous abnormality. Electronically Signed   By: Donavan Foil M.D.   On: 05/05/2020 17:48   ____________________________________________   PROCEDURES  Procedures  ____________________________________________   INITIAL IMPRESSION / ASSESSMENT AND PLAN / ED COURSE  Ruth Stout is a 75 y.o. who presents to the emergency department for treatment and evaluation of left  foot swelling.  See HPI for further details.  Exam is reassuring.  No indication of cellulitis or DVT.  She does have some 1+ pitting edema.  She was encouraged to decrease her sodium intake and wear compression stockings.  She was encouraged to see primary care for symptoms that do not improve with rest, elevation, compression, and sodium reduction.  She was encouraged to return to the emergency department if she develops chest pain, shortness of breath, or other concerns if she is unable to see primary care.  Medications - No data to display  Pertinent labs & imaging results that were available during my care of the patient were reviewed by me and considered in my medical decision making (see chart for details).   _________________________________________   FINAL CLINICAL IMPRESSION(S) / ED DIAGNOSES  Final diagnoses:  Peripheral edema    ED Discharge Orders         Ordered    Compression stockings        05/05/20 1756           If controlled substance prescribed during this visit, 12 month history viewed on the [place] prior to issuing an initial prescription for Schedule II or III opiod.   Victorino Dike, FNP 05/05/20 Kathlyn Sacramento, MD 05/05/20 2223

## 2020-05-05 NOTE — ED Triage Notes (Signed)
Pt comes with c/o left foot pain and swelling. Pt states this started last Thursday. Pt states pain when trying to ambulate. Pt states pain to touch as well.

## 2020-05-05 NOTE — Discharge Instructions (Signed)
Please decrease your sodium intake.  Elevate your feet off and on throughout the day.  Wear your compression stockings when out of bed.  Follow up with primary care when possible.   Return to the ER if you develop shortness of breath or if swelling/pain worsens.

## 2020-05-19 ENCOUNTER — Telehealth: Payer: Self-pay

## 2020-05-19 NOTE — Telephone Encounter (Signed)
Copied from [place] [id number]. Topic: General - Other >> May 19, 2020  1:11 PM Pawlus, Brayton Layman A wrote: PT would like the new patient paperwork sent over prior to her appt, states she has not gotten anything yet. PT also stated she wanted to know who Dr Army Melia usually refers patients too regarding pain management. Please advise.

## 2020-05-19 NOTE — Telephone Encounter (Signed)
Left message on voicemail to inform patient.

## 2020-06-16 ENCOUNTER — Encounter: Payer: Self-pay | Admitting: *Deleted

## 2020-06-29 ENCOUNTER — Encounter: Payer: Self-pay | Admitting: *Deleted

## 2020-07-14 ENCOUNTER — Ambulatory Visit: Payer: Self-pay | Admitting: Internal Medicine

## 2020-07-17 ENCOUNTER — Encounter: Payer: Self-pay | Admitting: Student in an Organized Health Care Education/Training Program

## 2020-07-28 ENCOUNTER — Ambulatory Visit: Payer: Medicare Other | Admitting: Student in an Organized Health Care Education/Training Program

## 2020-08-19 ENCOUNTER — Other Ambulatory Visit (HOSPITAL_COMMUNITY): Payer: Self-pay | Admitting: Physical Medicine & Rehabilitation

## 2020-08-19 ENCOUNTER — Other Ambulatory Visit: Payer: Self-pay | Admitting: Physical Medicine & Rehabilitation

## 2020-08-19 DIAGNOSIS — M5441 Lumbago with sciatica, right side: Secondary | ICD-10-CM

## 2020-08-19 DIAGNOSIS — M5442 Lumbago with sciatica, left side: Secondary | ICD-10-CM

## 2020-08-31 ENCOUNTER — Ambulatory Visit
Admission: RE | Admit: 2020-08-31 | Discharge: 2020-08-31 | Disposition: A | Discharge status: Home / Self Care | Payer: Medicare Other | Source: Ambulatory Visit | Attending: Physical Medicine & Rehabilitation | Admitting: Physical Medicine & Rehabilitation

## 2020-08-31 ENCOUNTER — Other Ambulatory Visit: Payer: Self-pay

## 2020-08-31 DIAGNOSIS — M5441 Lumbago with sciatica, right side: Secondary | ICD-10-CM | POA: Insufficient documentation

## 2020-08-31 DIAGNOSIS — M5442 Lumbago with sciatica, left side: Secondary | ICD-10-CM

## 2020-09-01 ENCOUNTER — Other Ambulatory Visit: Payer: Self-pay | Admitting: Physical Medicine & Rehabilitation

## 2020-09-01 DIAGNOSIS — M542 Cervicalgia: Secondary | ICD-10-CM

## 2020-09-12 ENCOUNTER — Other Ambulatory Visit: Payer: Self-pay

## 2020-09-12 ENCOUNTER — Ambulatory Visit
Admission: RE | Admit: 2020-09-12 | Discharge: 2020-09-12 | Disposition: A | Discharge status: Home / Self Care | Payer: Medicare Other | Source: Ambulatory Visit | Attending: Physical Medicine & Rehabilitation | Admitting: Physical Medicine & Rehabilitation

## 2020-09-12 DIAGNOSIS — M542 Cervicalgia: Secondary | ICD-10-CM | POA: Insufficient documentation

## 2021-01-22 ENCOUNTER — Other Ambulatory Visit: Payer: Self-pay | Admitting: Internal Medicine

## 2021-01-22 DIAGNOSIS — I208 Other forms of angina pectoris: Secondary | ICD-10-CM

## 2021-01-28 ENCOUNTER — Emergency Department (HOSPITAL_COMMUNITY): Payer: Medicare Other

## 2021-01-28 ENCOUNTER — Emergency Department (HOSPITAL_COMMUNITY)
Admission: EM | Admit: 2021-01-28 | Discharge: 2021-01-29 | Disposition: A | Discharge status: Home / Self Care | Payer: Medicare Other | Attending: Emergency Medicine | Admitting: Emergency Medicine

## 2021-01-28 ENCOUNTER — Other Ambulatory Visit: Payer: Self-pay

## 2021-01-28 ENCOUNTER — Encounter (HOSPITAL_COMMUNITY): Payer: Self-pay

## 2021-01-28 DIAGNOSIS — S4991XA Unspecified injury of right shoulder and upper arm, initial encounter: Secondary | ICD-10-CM | POA: Diagnosis present

## 2021-01-28 DIAGNOSIS — W010XXA Fall on same level from slipping, tripping and stumbling without subsequent striking against object, initial encounter: Secondary | ICD-10-CM | POA: Diagnosis not present

## 2021-01-28 DIAGNOSIS — Y92009 Unspecified place in unspecified non-institutional (private) residence as the place of occurrence of the external cause: Secondary | ICD-10-CM | POA: Insufficient documentation

## 2021-01-28 DIAGNOSIS — E039 Hypothyroidism, unspecified: Secondary | ICD-10-CM | POA: Insufficient documentation

## 2021-01-28 DIAGNOSIS — E119 Type 2 diabetes mellitus without complications: Secondary | ICD-10-CM | POA: Insufficient documentation

## 2021-01-28 DIAGNOSIS — S43014A Anterior dislocation of right humerus, initial encounter: Secondary | ICD-10-CM | POA: Diagnosis not present

## 2021-01-28 DIAGNOSIS — S43004A Unspecified dislocation of right shoulder joint, initial encounter: Secondary | ICD-10-CM

## 2021-01-28 MED ORDER — HYDROMORPHONE HCL 2 MG PO TABS: 2.0000 mg | ORAL_TABLET | Freq: Four times a day (QID) | ORAL | 0 refills | Status: AC | PRN

## 2021-01-28 MED ORDER — PROPOFOL 10 MG/ML IV BOLUS
1.0000 mg/kg | Freq: Once | INTRAVENOUS | Status: AC
  Administered 2021-01-28: 80 mg via INTRAVENOUS
  Filled 2021-01-28: qty 20

## 2021-01-28 MED ORDER — FENTANYL CITRATE PF 50 MCG/ML IJ SOSY
50.0000 ug | PREFILLED_SYRINGE | Freq: Once | INTRAMUSCULAR | Status: AC
  Administered 2021-01-28: 50 ug via INTRAVENOUS
  Filled 2021-01-28: qty 1

## 2021-01-28 NOTE — ED Provider Notes (Signed)
[facility]ER[facility] DEPARTMENT Provider Note   CSN: [id number] Arrival date & time: 01/28/21  1921     History No chief complaint on file.   Ruth Stout is a 75 y.o. female.  Presenting to the emergency room with concern for fall.  Patient states that she tripped over a box at home and landed on her right shoulder.  Thinks that she also hit her head.  Did not lose consciousness.  Is only having pain in her right shoulder.  Does endorse slight tingling sensation in some of her fingers.  Has been ambulatory since accident.  Not on blood thinners.  Former Therapist, sports.  Has history of diabetes, neuropathy, fibromyalgia.  HPI     Past Medical History:  Diagnosis Date   Arthritis    Diabetes mellitus without complication (HCC)    Diabetic neuropathy ([place])    Fibromyalgia    Hypothyroid    Insomnia    Thyroid disease     There are no problems to display for this patient.   History reviewed. No pertinent surgical history.   OB History   No obstetric history on file.     History reviewed. No pertinent family history.  Social History   Tobacco Use   Smoking status: Never   Smokeless tobacco: Never  Vaping Use   Vaping Use: Never used  Substance Use Topics   Alcohol use: Yes    Comment: occ   Drug use: Yes    Comment: prescribed oxy    Home Medications Prior to Admission medications   Medication Sig Start Date End Date Taking? Authorizing Provider  HYDROmorphone (DILAUDID) 2 MG tablet Take 1 tablet (2 mg total) by mouth every 6 (six) hours as needed for up to 3 days for severe pain. 01/28/21 01/31/21 Yes Shivaan Tierno, Ellwood Dense, MD    Allergies    Cephalexin, Codeine, Levofloxacin in d5w, Other, Penicillins, and Sulfa antibiotics  Review of Systems   Review of Systems  Constitutional:  Negative for chills and fever.  HENT:  Negative for ear pain and sore throat.   Eyes:  Negative for pain and visual disturbance.  Respiratory:  Negative for cough  and shortness of breath.   Cardiovascular:  Negative for chest pain and palpitations.  Gastrointestinal:  Negative for abdominal pain and vomiting.  Genitourinary:  Negative for dysuria and hematuria.  Musculoskeletal:  Positive for arthralgias. Negative for back pain.  Skin:  Negative for color change and rash.  Neurological:  Negative for seizures and syncope.  All other systems reviewed and are negative.  Physical Exam Updated Vital Signs BP 137/67   Pulse 73   Temp 98.7 F (37.1 C) (Oral)   Resp 12   SpO2 99%   Physical Exam Vitals and nursing note reviewed.  Constitutional:      General: She is not in acute distress.    Appearance: She is well-developed.  HENT:     Head: Normocephalic and atraumatic.  Eyes:     Conjunctiva/sclera: Conjunctivae normal.  Cardiovascular:     Rate and Rhythm: Normal rate and regular rhythm.     Heart sounds: No murmur heard. Pulmonary:     Effort: Pulmonary effort is normal. No respiratory distress.     Breath sounds: Normal breath sounds.  Abdominal:     Palpations: Abdomen is soft.     Tenderness: There is no abdominal tenderness.  Musculoskeletal:     Cervical back: Neck supple.     Comments: Back: no  C, T, L spine TTP, no step off or deformity RUE: There is some tenderness to the right shoulder, swelling to the shoulder, no other tenderness throughout extremity, normal radial pulse, normal strength, reports sensation to light touch mildly decreased over the third through fifth fingers  LUE: no TTP throughout, no deformity, normal joint ROM, radial pulse intact, distal sensation and motor intact RLE:  no TTP throughout, no deformity, normal joint ROM, distal pulse, sensation and motor intact LLE: no TTP throughout, no deformity, normal joint ROM, distal pulse, sensation and motor intact  Skin:    General: Skin is warm and dry.  Neurological:     General: No focal deficit present.     Mental Status: She is alert.  Psychiatric:         Mood and Affect: Mood normal.    ED Results / Procedures / Treatments   Labs (all labs ordered are listed, but only abnormal results are displayed) Labs Reviewed - No data to display  EKG None  Radiology DG Shoulder Right  Result Date: 01/28/2021 CLINICAL DATA:  Right shoulder pain after fall today. EXAM: RIGHT SHOULDER - 2+ VIEW COMPARISON:  None. FINDINGS: Anterior dislocation of proximal right humerus is noted relative to glenoid fossa. No definite fracture is noted. IMPRESSION: Anterior dislocation of right shoulder. Electronically Signed   By: Marijo Conception M.D.   On: 01/28/2021 20:13   DG Shoulder Right Portable  Result Date: 01/28/2021 CLINICAL DATA:  Status post reduction EXAM: PORTABLE RIGHT SHOULDER COMPARISON:  Film from earlier in the same day. FINDINGS: There is been interval reduction of the humeral head into the glenoid. Degenerative changes of the acromioclavicular joint are seen. Mild double density is noted along the body of the scapula. Possibility of undisplaced scapular fracture could not be totally excluded. Calcified granuloma is noted in the right lung. IMPRESSION: Interval reduction of the right humeral head. Degenerative changes of the acromioclavicular joint are seen. Double density over the midportion of the right scapular body is seen which may represent an undisplaced fracture. Electronically Signed   By: Inez Catalina M.D.   On: 01/28/2021 22:30    Procedures .Sedation  Date/Time: 01/29/2021 3:09 PM Performed by: Lucrezia Starch, MD Authorized by: Lucrezia Starch, MD   Consent:    Consent obtained:  Verbal and written   Consent given by:  Patient   Risks discussed:  Allergic reaction, dysrhythmia, inadequate sedation, nausea, vomiting, respiratory compromise necessitating ventilatory assistance and intubation, prolonged sedation necessitating reversal and prolonged hypoxia resulting in organ damage   Alternatives discussed:  Analgesia without  sedation, anxiolysis and regional anesthesia Universal protocol:    Site/side marked: yes     Immediately prior to procedure, a time out was called: yes     Patient identity confirmed:  Provided demographic data and verbally with patient Indications:    Procedure performed:  Dislocation reduction Pre-sedation assessment:    Time since last food or drink:  Greater than 4 hours   ASA classification: class 1 - normal, healthy patient     Mallampati score:  I - soft palate, uvula, fauces, pillars visible   Neck mobility: normal     Pre-sedation assessments completed and reviewed: airway patency, cardiovascular function, hydration status, mental status, nausea/vomiting, pain level, respiratory function and temperature   Immediate pre-procedure details:    Reviewed: vital signs     Verified: bag valve mask available, emergency equipment available, intubation equipment available, IV patency confirmed, oxygen available, reversal medications  available and suction available   Procedure details (see MAR for exact dosages):    Preoxygenation:  Nasal cannula   Sedation:  Propofol   Intended level of sedation: deep   Analgesia:  None   Intra-procedure events: none     Total Provider sedation time (minutes):  15 Post-procedure details:    Post-sedation assessments completed and reviewed: airway patency, cardiovascular function, hydration status, mental status, nausea/vomiting, pain level, respiratory function and temperature     Patient is stable for discharge or admission: yes     Procedure completion:  Tolerated well, no immediate complications .Ortho Injury Treatment  Date/Time: 01/29/2021 3:11 PM Performed by: Lucrezia Starch, MD Authorized by: Lucrezia Starch, MD   Consent:    Consent obtained:  Verbal and written   Consent given by:  Patient   Risks discussed:  Fracture, irreducible dislocation, recurrent dislocation, nerve damage, restricted joint movement, stiffness and vascular  damage   Alternatives discussed:  No treatmentInjury location: shoulder Location details: right shoulder Injury type: dislocation Dislocation type: anterior Hill-Sachs deformity: no Chronicity: new Pre-procedure distal perfusion: normal Pre-procedure neurological function: normal Pre-procedure neurological function comment: reported slight tingling in 3-5 fingers Pre-procedure range of motion: normal  Anesthesia: Local anesthesia used: no  Patient sedated: Yes. Refer to sedation procedure documentation for details of sedation. Manipulation performed: yes Reduction method: traction and counter traction Reduction successful: yes X-ray confirmed reduction: yes Immobilization: sling Post-procedure neurovascular assessment: post-procedure neurovascularly intact Post-procedure distal perfusion: normal Post-procedure neurological function comment: reported slight tingling to 3-5 fingers, otherwise normal Post-procedure range of motion: normal     Medications Ordered in ED Medications  fentaNYL (SUBLIMAZE) injection 50 mcg (50 mcg Intravenous Given 01/28/21 2052)  fentaNYL (SUBLIMAZE) injection 50 mcg (50 mcg Intravenous Given 01/28/21 2100)  propofol (DIPRIVAN) 10 mg/mL bolus/IV push 80 mg (80 mg Intravenous Given 01/28/21 2208)  fentaNYL (SUBLIMAZE) injection 50 mcg (50 mcg Intravenous Given 01/28/21 2345)    ED Course  I have reviewed the triage vital signs and the nursing notes.  Pertinent labs & imaging results that were available during my care of the patient were reviewed by me and considered in my medical decision making (see chart for details).    MDM Rules/Calculators/A&P                         75 year old lady presents to ER after fall.  Reported mechanical fall.  She reports that she hit her head though I did not appreciate any significant head trauma and I recommended patient have head CT to further investigate however after lengthy discussion regarding the benefits  of head imaging and risks of not performing this, patient declined this test.  Found to have anterior right shoulder dislocation.  Also has possible scapular fracture.  Successful reduction, required sedation.  Patient tolerated well.  Post reduction x-ray confirmed reduction.  On initial exam, patient was neurovascularly intact except for reported slight tingling sensation in digits 3 through 5 on the right hand.  She had otherwise normal strength and sensation, normal radial pulse.  Post reduction, exam was the same.  For now, patient will be placed in sling.  Instructed patient that she will need close orthopedic outpatient follow-up.  For now recommend using sling and nonweightbearing in her right arm.  Reviewed, precautions and discharged.  After the discussed management above, the patient was determined to be safe for discharge.  The patient was in agreement with this plan and all  questions regarding their care were answered.  ED return precautions were discussed and the patient will return to the ED with any significant worsening of condition.   Final Clinical Impression(s) / ED Diagnoses Final diagnoses:  Shoulder dislocation, right, initial encounter    Rx / DC Orders ED Discharge Orders          Ordered    HYDROmorphone (DILAUDID) 2 MG tablet  Every 6 hours PRN        01/28/21 2343             Lucrezia Starch, MD 01/29/21 [id number]

## 2021-01-28 NOTE — Progress Notes (Signed)
Assisted MD with conscious sedation, no issues throughout the procedure. Pt awake and talking at this time.

## 2021-01-28 NOTE — Discharge Instructions (Addendum)
Please follow-up with orthopedics.  For now, recommend using sling and avoid weightbearing activities in your right arm.  Do not raise your right arm.  For pain medicine, recommend Tylenol and Motrin primarily and for breakthrough pain you can take either your previously prescribed Percocet or you may try the oral Dilaudid.  However you should never take both of these medications at the same time.  Please note they can make you drowsy and should not be taken while driving or operating heavy machinery

## 2021-01-28 NOTE — ED Triage Notes (Signed)
Pt bib EMS from home due to tripping and falling over a box at home. Landed on shoulder, slight disformity and swelling noted. Denies syncopal episode, lightheadedness, and dizziness.   Vitals WDL.

## 2021-02-10 ENCOUNTER — Inpatient Hospital Stay: Admission: RE | Admit: 2021-02-10 | Payer: Medicare Other | Source: Ambulatory Visit

## 2021-03-01 ENCOUNTER — Other Ambulatory Visit: Payer: Medicare Other

## 2021-03-23 ENCOUNTER — Other Ambulatory Visit: Payer: Medicare Other

## 2021-06-29 ENCOUNTER — Encounter: Payer: Self-pay | Admitting: Psychiatry

## 2021-06-29 ENCOUNTER — Other Ambulatory Visit: Payer: Self-pay

## 2021-06-29 ENCOUNTER — Ambulatory Visit (INDEPENDENT_AMBULATORY_CARE_PROVIDER_SITE_OTHER): Payer: Medicare Other | Admitting: Psychiatry

## 2021-06-29 VITALS — BP 138/73 | HR 97 | Temp 98.5°F | Wt 160.4 lb

## 2021-06-29 DIAGNOSIS — R41840 Attention and concentration deficit: Secondary | ICD-10-CM

## 2021-06-29 DIAGNOSIS — Z794 Long term (current) use of insulin: Secondary | ICD-10-CM | POA: Insufficient documentation

## 2021-06-29 DIAGNOSIS — F411 Generalized anxiety disorder: Secondary | ICD-10-CM

## 2021-06-29 DIAGNOSIS — G8929 Other chronic pain: Secondary | ICD-10-CM | POA: Insufficient documentation

## 2021-06-29 DIAGNOSIS — R790 Abnormal level of blood mineral: Secondary | ICD-10-CM | POA: Insufficient documentation

## 2021-06-29 DIAGNOSIS — F339 Major depressive disorder, recurrent, unspecified: Secondary | ICD-10-CM | POA: Insufficient documentation

## 2021-06-29 DIAGNOSIS — R269 Unspecified abnormalities of gait and mobility: Secondary | ICD-10-CM | POA: Insufficient documentation

## 2021-06-29 DIAGNOSIS — Z79899 Other long term (current) drug therapy: Secondary | ICD-10-CM

## 2021-06-29 DIAGNOSIS — E1140 Type 2 diabetes mellitus with diabetic neuropathy, unspecified: Secondary | ICD-10-CM | POA: Insufficient documentation

## 2021-06-29 DIAGNOSIS — G9332 Myalgic encephalomyelitis/chronic fatigue syndrome: Secondary | ICD-10-CM | POA: Insufficient documentation

## 2021-06-29 DIAGNOSIS — I10 Essential (primary) hypertension: Secondary | ICD-10-CM | POA: Insufficient documentation

## 2021-06-29 DIAGNOSIS — E1136 Type 2 diabetes mellitus with diabetic cataract: Secondary | ICD-10-CM | POA: Insufficient documentation

## 2021-06-29 DIAGNOSIS — K5090 Crohn's disease, unspecified, without complications: Secondary | ICD-10-CM | POA: Insufficient documentation

## 2021-06-29 DIAGNOSIS — E785 Hyperlipidemia, unspecified: Secondary | ICD-10-CM | POA: Insufficient documentation

## 2021-06-29 DIAGNOSIS — D509 Iron deficiency anemia, unspecified: Secondary | ICD-10-CM | POA: Insufficient documentation

## 2021-06-29 MED ORDER — SERTRALINE HCL 100 MG PO TABS: 50.0000 mg | ORAL_TABLET | ORAL | 0 refills | Status: DC
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
## 2021-07-08 ENCOUNTER — Encounter: Payer: Self-pay | Admitting: Psychology

## 2021-07-13 ENCOUNTER — Other Ambulatory Visit: Payer: Self-pay | Admitting: Neurology

## 2021-07-13 ENCOUNTER — Encounter: Payer: Self-pay | Admitting: Psychiatry

## 2021-07-13 DIAGNOSIS — F959 Tic disorder, unspecified: Secondary | ICD-10-CM

## 2021-07-13 LAB — SODIUM: Sodium: 132 mmol/L — ABNORMAL LOW (ref 134–144)

## 2021-07-22 ENCOUNTER — Ambulatory Visit: Payer: Medicare Other

## 2021-08-03 ENCOUNTER — Ambulatory Visit
Admission: RE | Admit: 2021-08-03 | Discharge: 2021-08-03 | Disposition: A | Discharge status: Home / Self Care | Payer: Medicare Other | Source: Ambulatory Visit | Attending: Neurology | Admitting: Neurology

## 2021-08-03 DIAGNOSIS — F959 Tic disorder, unspecified: Secondary | ICD-10-CM | POA: Insufficient documentation

## 2021-08-03 MED ORDER — GADOBUTROL 1 MMOL/ML IV SOLN
7.0000 mL | Freq: Once | INTRAVENOUS | Status: AC | PRN
  Administered 2021-08-03: 7 mL via INTRAVENOUS

## 2021-09-07 ENCOUNTER — Telehealth: Payer: Self-pay

## 2021-09-07 NOTE — Telephone Encounter (Signed)
pt called states she is concerned about medication she is compuslively eatting and her blood sugars are around 400. she needs for you to call her with what she needs to do.

## 2021-09-07 NOTE — Telephone Encounter (Signed)
Please let patient know to schedule an appointment for evaluation and medication management. For her Blood sugar level in the mean time please ask her to follow up with primary care.

## 2021-09-08 ENCOUNTER — Encounter: Payer: Medicare Other | Attending: Psychology | Admitting: Psychology

## 2021-09-08 ENCOUNTER — Encounter: Payer: Self-pay | Admitting: Psychology

## 2021-09-08 DIAGNOSIS — R41840 Attention and concentration deficit: Secondary | ICD-10-CM | POA: Insufficient documentation

## 2021-09-08 DIAGNOSIS — F0630 Mood disorder due to known physiological condition, unspecified: Secondary | ICD-10-CM | POA: Insufficient documentation

## 2021-09-08 DIAGNOSIS — F411 Generalized anxiety disorder: Secondary | ICD-10-CM | POA: Insufficient documentation

## 2021-09-08 NOTE — Telephone Encounter (Signed)
spoke with patient she states that she seen her endo Dr. today and he put her on a sliding scale of insulin and she see Dr. Edd Arbour and he going to do more testing on her besides the ADHD testing.   Patient was also made an next available for June 23rd appt
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
## 2021-09-09 ENCOUNTER — Telehealth: Payer: Self-pay

## 2021-09-09 NOTE — Telephone Encounter (Signed)
Ruth Stout had to leave yesterday ASAP for an appointment at [facility]. I called her today to schedule her follow up with [facility]. Per Dr. Sima Matas she will need 2 hours with [facility] for CAB & CPT testing.    Ruth Stout was not available today. A message was left of her to call back and schedule.

## 2021-10-01 ENCOUNTER — Ambulatory Visit (INDEPENDENT_AMBULATORY_CARE_PROVIDER_SITE_OTHER): Payer: Medicare Other | Admitting: Psychiatry

## 2021-10-01 ENCOUNTER — Encounter: Payer: Self-pay | Admitting: Psychiatry

## 2021-10-01 VITALS — BP 112/72 | HR 78 | Temp 98.5°F | Wt 167.2 lb

## 2021-10-01 DIAGNOSIS — F411 Generalized anxiety disorder: Secondary | ICD-10-CM | POA: Diagnosis not present

## 2021-10-01 DIAGNOSIS — R41840 Attention and concentration deficit: Secondary | ICD-10-CM

## 2021-10-01 DIAGNOSIS — Z79899 Other long term (current) drug therapy: Secondary | ICD-10-CM

## 2021-10-01 DIAGNOSIS — F0630 Mood disorder due to known physiological condition, unspecified: Secondary | ICD-10-CM | POA: Insufficient documentation

## 2021-10-01 DIAGNOSIS — F32A Depression, unspecified: Secondary | ICD-10-CM

## 2021-10-01 MED ORDER — AMITRIPTYLINE HCL 25 MG PO TABS: 25.0000 mg | ORAL_TABLET | Freq: Every day | ORAL | 0 refills | Status: DC

## 2021-10-19 ENCOUNTER — Encounter: Payer: Medicare Other | Attending: Psychology

## 2021-10-19 DIAGNOSIS — G9332 Myalgic encephalomyelitis/chronic fatigue syndrome: Secondary | ICD-10-CM | POA: Insufficient documentation

## 2021-10-19 DIAGNOSIS — F411 Generalized anxiety disorder: Secondary | ICD-10-CM | POA: Insufficient documentation

## 2021-10-19 DIAGNOSIS — R41840 Attention and concentration deficit: Secondary | ICD-10-CM | POA: Insufficient documentation

## 2021-10-19 DIAGNOSIS — E1142 Type 2 diabetes mellitus with diabetic polyneuropathy: Secondary | ICD-10-CM | POA: Insufficient documentation

## 2021-10-19 DIAGNOSIS — F4310 Post-traumatic stress disorder, unspecified: Secondary | ICD-10-CM | POA: Insufficient documentation

## 2021-10-19 DIAGNOSIS — G90513 Complex regional pain syndrome I of upper limb, bilateral: Secondary | ICD-10-CM | POA: Insufficient documentation

## 2021-10-19 NOTE — Progress Notes (Signed)
   Behavioral Observations  The patient appeared well-groomed and appropriately dressed. Her manners were polite and appropriate to the situation. The patient was very talkative and showed some signs of impulsivity, including interrupting and talking over instructions, and was fidgety throughout the test. The patient complained that touching the screen caused pain in her shoulder, so we switched to the use of a mouse. Even after making this switch, the patient continued to complain of shoulder pain. The patient had significant difficulty understanding, remembering, and following instructions. Extra time was spent explaining instructions, answering questions, and repeating trials.             Neuropsychology Note  [name] completed 120 minutes of neuropsychological testing with technician, Dina Rich, BA, under the supervision of Ilean Skill, PsyD., Clinical Neuropsychologist. The patient did not appear overtly distressed by the testing session, per behavioral observation or via self-report to the technician. Rest breaks were offered.   Clinical Decision Making: In considering the patient's current level of functioning, level of presumed impairment, nature of symptoms, emotional and behavioral responses during clinical interview, level of literacy, and observed level of motivation/effort, a battery of tests was selected by Dr. Sima Matas during initial consultation on 09/08/2021. This was communicated to the technician. Communication between the neuropsychologist and technician was ongoing throughout the testing session and changes were made as deemed necessary based on patient performance on testing, technician observations and additional pertinent factors such as those listed above.  Tests Administered: Comprehensive Attention Battery (CAB) Continuous Performance Test (CPT)  Results: Will be included in final report   Feedback to Patient: Ruth Stout will return on 03/16/2022 or  sooner for an interactive feedback session with Dr. Sima Matas at which time her test performances, clinical impressions and treatment recommendations will be reviewed in detail. The patient understands she can contact our office should she require our assistance before this time.  120 minutes spent face-to-face with patient administering standardized tests, 30 minutes spent scoring Environmental education officer). [CPT [id number], [id number]]  Full report to follow.

## 2021-11-10 ENCOUNTER — Telehealth (INDEPENDENT_AMBULATORY_CARE_PROVIDER_SITE_OTHER): Payer: Medicare Other | Admitting: Psychiatry

## 2021-11-10 ENCOUNTER — Encounter: Payer: Self-pay | Admitting: Psychiatry

## 2021-11-10 DIAGNOSIS — R41840 Attention and concentration deficit: Secondary | ICD-10-CM

## 2021-11-10 DIAGNOSIS — F32A Depression, unspecified: Secondary | ICD-10-CM | POA: Diagnosis not present

## 2021-11-10 DIAGNOSIS — F411 Generalized anxiety disorder: Secondary | ICD-10-CM | POA: Diagnosis not present

## 2021-11-10 NOTE — Progress Notes (Unsigned)
Virtual Visit via Video Note  I connected with Ruth Stout on 35/32/99 at  3:00 PM EDT by a video enabled telemedicine application and verified that I am speaking with the correct person using two identifiers.  Location Provider Location : [facility] Patient Location : Home  Participants: Patient , Provider   I discussed the limitations of evaluation and management by telemedicine and the availability of in person appointments. The patient expressed understanding and agreed to proceed.   I discussed the assessment and treatment plan with the patient. The patient was provided an opportunity to ask questions and all were answered. The patient agreed with the plan and demonstrated an understanding of the instructions.   The patient was advised to call back or seek an in-person evaluation if the symptoms worsen or if the condition fails to improve as anticipated.   [place] MD OP Progress Note  05/15/2681 4:19 AM Ruth Stout  MRN:  [id number]  Chief Complaint:  Chief Complaint  Patient presents with   Follow-up: 76 year old Caucasian female with history of GAD, attention and concentration deficit, presented for medication management.   HPI: Ruth Stout is a 76 year old Caucasian female, married, retired, lives in [place], has a history of GAD, attention and concentration deficit, long-term use of opioids and benzodiazepines, diabetes mellitus, chronic kidney disease, fibromyalgia, hypothyroidism, Mnire's disease, hypercholesterolemia was evaluated by telemedicine today.  Patient reports she does struggle with situational anxiety and sadness on and off.  She however reports she has been handling okay.  She has been sleeping better on the amitriptyline higher dosage.  Continues to be compliant on the Wellbutrin.  Does have dry mouth likely from combination of her medications however she believes it could be coming from the Wellbutrin.  She however would like to stay on this combination of  medication for now. She is not interested in any changes.  Had neuropsychological testing completed recently and has upcoming appointment for follow-up.  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  Continues to follow-up with neurology.  Reports she does have an increased appetite, some binging on food on and off.  Was prescribed Ozempic recently by her primary provider, planning to start this medication soon.  Denies any suicidality, homicidality or perceptual disturbances.  Patient denies any other concerns today.  Visit Diagnosis:    ICD-10-CM   1. GAD (generalized anxiety disorder)  F41.1     2. Attention and concentration deficit  R41.840     3. Depression, unspecified depression type  F32.A       Past Psychiatric History: Reviewed past psychiatric history from progress note on 06/29/2021.  Past trials of medications like Wellbutrin, sertraline, amitriptyline.  Past Medical History:  Past Medical History:  Diagnosis Date   Arthritis    Diabetes mellitus without complication (HCC)    Diabetic neuropathy ([place])    Fibromyalgia    Hypothyroid    Insomnia    Thyroid disease     Past Surgical History:  Procedure Laterality Date   APPENDECTOMY     partial hysterectomy     tmj implants     TONSILLECTOMY     WRIST SURGERY      Family Psychiatric History: Reviewed family psychiatric history from progress note on 06/29/2021.  Family History:  Family History  Adopted: Yes    Social History: Reviewed social history from progress note on 06/29/2021. Social History   Socioeconomic History   Marital status: Married    Spouse name: Not on  file   Number of children: Not on file   Years of education: Not on file   Highest education level: Not on file  Occupational History   Not on file  Tobacco Use   Smoking status: Never   Smokeless tobacco: Never  Vaping Use   Vaping Use: Never used  Substance and Sexual Activity   Alcohol use:  Not Currently    Comment: occ   Drug use: Never    Comment: prescribed oxy   Sexual activity: Not Currently  Other Topics Concern   Not on file  Social History Narrative   Not on file   Social Determinants of Health   Financial Resource Strain: Not on file  Food Insecurity: Not on file  Transportation Needs: Not on file  Physical Activity: Not on file  Stress: Not on file  Social Connections: Not on file    Allergies:  Allergies  Allergen Reactions   Cephalexin    Clarithromycin Other (See Comments)   Codeine    Erythromycin Base    Levofloxacin In D5w    Other     wygesic   Penicillins    Sulfa Antibiotics     Metabolic Disorder Labs: No results found for: "HGBA1C", "MPG" No results found for: "PROLACTIN" No results found for: "CHOL", "TRIG", "HDL", "CHOLHDL", "VLDL", "LDLCALC" Lab Results  Component Value Date   TSH 0.800 06/23/2019    Therapeutic Level Labs: No results found for: "LITHIUM" No results found for: "VALPROATE" No results found for: "CBMZ"  Current Medications: Current Outpatient Medications  Medication Sig Dispense Refill   amitriptyline (ELAVIL) 100 MG tablet Take by mouth.     amitriptyline (ELAVIL) 25 MG tablet Take 1 tablet (25 mg total) by mouth at bedtime. Take along with 100 mg at bedtime 90 tablet 0   bisacodyl (DULCOLAX) 10 MG suppository Place rectally.     buPROPion (WELLBUTRIN XL) 150 MG 24 hr tablet Take by mouth.     Coenzyme Q10 100 MG capsule Take by mouth.     Docusate Sodium (DSS) 100 MG CAPS Take by mouth.     gabapentin (NEURONTIN) 100 MG capsule Take 100 mg by mouth 3 (three) times daily.     glimepiride (AMARYL) 4 MG tablet Take by mouth.     insulin glargine (LANTUS SOLOSTAR) 100 UNIT/ML Solostar Pen Inject into the skin.     insulin lispro (HUMALOG) 100 UNIT/ML KwikPen Inject into the skin.     levothyroxine (SYNTHROID) 50 MCG tablet Take by mouth.     losartan (COZAAR) 50 MG tablet Take 1 tablet by mouth daily.      LINZESS 72 MCG capsule Take 72 mcg by mouth every morning. (Patient not taking: Reported on 11/10/2021)     Semaglutide,0.25 or 0.5MG/DOS, 2 MG/3ML SOPN Inject into the skin. (Patient not taking: Reported on 11/10/2021)     No current facility-administered medications for this visit.     Musculoskeletal: Strength & Muscle Tone:  UTA Gait & Station:  Seated Patient leans: N/A  Psychiatric Specialty Exam: Review of Systems  HENT:         Dry mouth  Eyes:        Dry eye  Psychiatric/Behavioral:  Positive for decreased concentration. The patient is nervous/anxious.   All other systems reviewed and are negative.   There were no vitals taken for this visit.There is no height or weight on file to calculate BMI.  General Appearance: Casual  Eye Contact:  Good  Speech:  Clear and Coherent  Volume:  Normal  Mood:  Anxious  Affect:  Congruent  Thought Process:  Goal Directed and Descriptions of Associations: Intact  Orientation:  Full (Time, Place, and Person)  Thought Content: Logical   Suicidal Thoughts:  No  Homicidal Thoughts:  No  Memory:  Immediate;   Fair Recent;   Fair Remote;   Fair  Judgement:  Fair  Insight:  Fair  Psychomotor Activity:  Normal  Concentration:  Concentration: Fair and Attention Span: Fair  Recall:  [employer] of Knowledge: Fair  Language: Fair  Akathisia:  No  Handed:  Right  AIMS (if indicated): done  Assets:  Communication Skills Desire for Improvement Housing Social Support  ADL's:  Intact  Cognition: WNL  Sleep:  Fair   Screenings: AIMS    Flowsheet Row Video Visit from 11/10/2021 in [facility] Office Visit from 10/01/2021 in [facility] Total Score 5 [place] [facility] Office Visit from 06/29/2021 in [facility]  Total GAD-7 Score 14      PHQ2-9    [place] Video Visit from 11/10/2021 in [facility] Visit from 10/01/2021 in [facility] Office Visit from 06/29/2021 in [facility]  PHQ-2 Total Score 1 2 0  PHQ-9 Total Score 7 13 9       Flowsheet Row Video Visit from 11/10/2021 in [facility] Office Visit from 10/01/2021 in [facility] Office Visit from 06/29/2021 in [facility] No Risk Low Risk Low Risk        Assessment and Plan: Ruth Stout is a 76 year old Caucasian female, married, retired, lives in [place], has a history of GAD, attention and focus deficit, facial tics, presented for medication management.  Patient is currently sleeping better and continues to have facial tics as well as anxiety and depression on and off although she is not interested in more medication changes at this time.  Plan as noted below.  Plan  GAD-improving Wellbutrin XL 150 mg p.o. daily Amitriptyline 125 mg p.o. nightly Patient advised to establish care with therapist-referred for CBT.  Depressive disorder unspecified-unstable Wellbutrin XL 150 mg p.o. daily Amitriptyline 125 mg p.o. nightly Referred for CBT  Attention and concentration deficit-unstable Completed neuropsychological testing per Dr. Sable Feil report.  Follow-up in clinic in 1 month or sooner if needed.   Collaboration of Care: Collaboration of Care: Referral or follow-up with counselor/therapist AEB encouraged to establish care with therapist, encouraged to follow up with neurology for facial tics.  Patient/Guardian was advised Release of Information must be obtained prior to any record release in order to collaborate their care with an outside provider. Patient/Guardian was advised if they have not already done so to contact the registration department to sign all necessary forms in order for [place] to release information regarding their  care.   Consent: Patient/Guardian gives verbal consent for treatment and assignment of benefits for services provided during this visit. Patient/Guardian expressed understanding and agreed to proceed.   This note was generated in part or whole with voice recognition software. Voice recognition is usually quite accurate but there are transcription errors that can and very often do occur. I apologize for any typographical errors that were not detected and corrected.      Ursula Alert, MD 11/11/2021, 8:39 AM

## 2021-11-15 ENCOUNTER — Encounter: Payer: Medicare Other | Attending: Psychology | Admitting: Psychology

## 2021-11-15 DIAGNOSIS — F411 Generalized anxiety disorder: Secondary | ICD-10-CM | POA: Insufficient documentation

## 2021-11-15 DIAGNOSIS — G9332 Myalgic encephalomyelitis/chronic fatigue syndrome: Secondary | ICD-10-CM | POA: Insufficient documentation

## 2021-11-15 DIAGNOSIS — R41840 Attention and concentration deficit: Secondary | ICD-10-CM | POA: Diagnosis not present

## 2021-11-15 DIAGNOSIS — E1142 Type 2 diabetes mellitus with diabetic polyneuropathy: Secondary | ICD-10-CM | POA: Diagnosis present

## 2021-11-15 DIAGNOSIS — F4312 Post-traumatic stress disorder, chronic: Secondary | ICD-10-CM | POA: Diagnosis not present

## 2021-11-15 DIAGNOSIS — G90513 Complex regional pain syndrome I of upper limb, bilateral: Secondary | ICD-10-CM | POA: Diagnosis present

## 2021-11-15 DIAGNOSIS — F0630 Mood disorder due to known physiological condition, unspecified: Secondary | ICD-10-CM | POA: Diagnosis present
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
## 2021-11-25 ENCOUNTER — Ambulatory Visit: Payer: Medicare Other | Admitting: Psychology

## 2021-11-29 ENCOUNTER — Encounter: Payer: Medicare Other | Admitting: Psychology

## 2021-11-29 DIAGNOSIS — F4312 Post-traumatic stress disorder, chronic: Secondary | ICD-10-CM | POA: Diagnosis not present

## 2021-11-29 DIAGNOSIS — E1142 Type 2 diabetes mellitus with diabetic polyneuropathy: Secondary | ICD-10-CM

## 2021-11-29 DIAGNOSIS — F411 Generalized anxiety disorder: Secondary | ICD-10-CM

## 2021-11-29 DIAGNOSIS — G90513 Complex regional pain syndrome I of upper limb, bilateral: Secondary | ICD-10-CM | POA: Diagnosis not present

## 2021-11-29 DIAGNOSIS — F0630 Mood disorder due to known physiological condition, unspecified: Secondary | ICD-10-CM | POA: Diagnosis not present

## 2021-11-29 DIAGNOSIS — R41840 Attention and concentration deficit: Secondary | ICD-10-CM

## 2021-11-29 DIAGNOSIS — G9332 Myalgic encephalomyelitis/chronic fatigue syndrome: Secondary | ICD-10-CM

## 2021-12-05 ENCOUNTER — Encounter: Payer: Self-pay | Admitting: Psychology
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
## 2021-12-20 ENCOUNTER — Telehealth (INDEPENDENT_AMBULATORY_CARE_PROVIDER_SITE_OTHER): Payer: Medicare Other | Admitting: Psychiatry

## 2021-12-20 ENCOUNTER — Encounter: Payer: Self-pay | Admitting: Psychiatry

## 2021-12-20 DIAGNOSIS — F4312 Post-traumatic stress disorder, chronic: Secondary | ICD-10-CM | POA: Insufficient documentation

## 2021-12-20 DIAGNOSIS — F0630 Mood disorder due to known physiological condition, unspecified: Secondary | ICD-10-CM

## 2021-12-20 DIAGNOSIS — F411 Generalized anxiety disorder: Secondary | ICD-10-CM | POA: Diagnosis not present

## 2021-12-20 NOTE — Progress Notes (Signed)
Virtual Visit via Video Note  I connected with Ruth Stout on 84/[birth year] at  1:30 PM EDT by a video enabled telemedicine application and verified that I am speaking with the correct person using two identifiers.  Location Provider Location : [facility] Patient Location : Home  Participants: Patient , Provider   I discussed the limitations of evaluation and management by telemedicine and the availability of in person appointments. The patient expressed understanding and agreed to proceed.    I discussed the assessment and treatment plan with the patient. The patient was provided an opportunity to ask questions and all were answered. The patient agreed with the plan and demonstrated an understanding of the instructions.   The patient was advised to call back or seek an in-person evaluation if the symptoms worsen or if the condition fails to improve as anticipated.   [place] MD OP Progress Note  9/35/7017 7:93 PM Ruth Stout  MRN:  [id number]  Chief Complaint:  Chief Complaint  Patient presents with   Follow-up: 76 year old Caucasian female with history of anxiety, presented for medication management.   HPI: Ruth Stout is a 76 year old female, married, retired, lives in [place], has a history of GAD, attention and concentration deficit, long-term use of opioids and benzodiazepines, diabetes mellitus, chronic kidney disease, fibromyalgia, hypothyroidism, Mnire's disease, hypercholesterolemia was evaluated by telemedicine today.  Patient completed neuropsychological testing with Dr. Sima Matas.  Reviewed and discussed the same-dated 11/29/2021-patient did not meet criteria for ADHD-attention and focus deficit likely underlying primary mood disorder with anxiety, depression, sleep problems, complex pain syndrome.  Patient likely with history of manic/hypomanic events in the past as well as a number of traumatic experience going back to childhood.'  Patient today reports she may have  had hypomanic symptoms like sexual acting out, impulsivity however this was a long time ago.  Currently denies any.  Currently denies any PTSD related symptoms.  Reports she continues to have anxiety although it is manageable.  Patient reports mood wise she feels better than last time.  Patient reports she believes the Wellbutrin may have made her dry mouth worse and hence she stopped taking it last Saturday.  She reports she feels better without it.  She also wonders whether the amitriptyline is making her tics worse, she has observed her tics to be worse since being on the higher dosage.  She has not been able to establish care with a therapist however is motivated to do so.  Currently denies any suicidality, homicidality or perceptual disturbances.  Patient denies any other concerns today.  Visit Diagnosis:    ICD-10-CM   1. GAD (generalized anxiety disorder)  F41.1     2. Mood disorder in conditions classified elsewhere  F06.30     3. Chronic post-traumatic stress disorder (PTSD)  F43.12       Past Psychiatric History: Reviewed past psychiatric history from progress note on 06/29/2021.  Past trials of medications like Wellbutrin, sertraline, amitriptyline.  Past Medical History:  Past Medical History:  Diagnosis Date   Arthritis    Diabetes mellitus without complication (HCC)    Diabetic neuropathy ([place])    Fibromyalgia    Hypothyroid    Insomnia    Thyroid disease     Past Surgical History:  Procedure Laterality Date   APPENDECTOMY     partial hysterectomy     tmj implants     TONSILLECTOMY     WRIST SURGERY      Family Psychiatric History: Reviewed family psychiatric  history from progress note on 06/29/2021.  Family History:  Family History  Adopted: Yes    Social History: Reviewed social history from progress note on 06/29/2021. Social History   Socioeconomic History   Marital status: Married    Spouse name: Not on file   Number of children: Not on file   Years  of education: Not on file   Highest education level: Not on file  Occupational History   Not on file  Tobacco Use   Smoking status: Never   Smokeless tobacco: Never  Vaping Use   Vaping Use: Never used  Substance and Sexual Activity   Alcohol use: Not Currently    Comment: occ   Drug use: Never    Comment: prescribed oxy   Sexual activity: Not Currently  Other Topics Concern   Not on file  Social History Narrative   Not on file   Social Determinants of Health   Financial Resource Strain: Not on file  Food Insecurity: Not on file  Transportation Needs: Not on file  Physical Activity: Not on file  Stress: Not on file  Social Connections: Not on file    Allergies:  Allergies  Allergen Reactions   Cephalexin    Clarithromycin Other (See Comments)   Codeine    Erythromycin Base    Levofloxacin In D5w    Other     wygesic   Penicillins    Sulfa Antibiotics     Metabolic Disorder Labs: No results found for: "HGBA1C", "MPG" No results found for: "PROLACTIN" No results found for: "CHOL", "TRIG", "HDL", "CHOLHDL", "VLDL", "LDLCALC" Lab Results  Component Value Date   TSH 0.800 06/23/2019    Therapeutic Level Labs: No results found for: "LITHIUM" No results found for: "VALPROATE" No results found for: "CBMZ"  Current Medications: Current Outpatient Medications  Medication Sig Dispense Refill   amitriptyline (ELAVIL) 100 MG tablet Take by mouth.     bisacodyl (DULCOLAX) 10 MG suppository Place rectally.     Coenzyme Q10 100 MG capsule Take by mouth.     Docusate Sodium (DSS) 100 MG CAPS Take by mouth.     glimepiride (AMARYL) 4 MG tablet Take by mouth.     insulin glargine (LANTUS SOLOSTAR) 100 UNIT/ML Solostar Pen Inject into the skin.     insulin lispro (HUMALOG) 100 UNIT/ML KwikPen Inject into the skin.     levothyroxine (SYNTHROID) 50 MCG tablet Take by mouth.     losartan (COZAAR) 50 MG tablet Take 1 tablet by mouth daily.     Semaglutide,0.25 or  0.5MG/DOS, 2 MG/3ML SOPN Inject into the skin.     LINZESS 72 MCG capsule Take 72 mcg by mouth every morning. (Patient not taking: Reported on 12/20/2021)     No current facility-administered medications for this visit.     Musculoskeletal: Strength & Muscle Tone:  UTA Gait & Station:  Seated Patient leans: N/A  Psychiatric Specialty Exam: Review of Systems  HENT:         Dry mouth  Eyes:        Dry eye  Psychiatric/Behavioral:  Positive for decreased concentration. The patient is nervous/anxious.   All other systems reviewed and are negative.   There were no vitals taken for this visit.There is no height or weight on file to calculate BMI.  General Appearance: Casual  Eye Contact:  Fair  Speech:  Clear and Coherent  Volume:  Normal  Mood:  Anxious  Affect:  Congruent  Thought Process:  Goal  Directed and Descriptions of Associations: Intact  Orientation:  Full (Time, Place, and Person)  Thought Content: Logical   Suicidal Thoughts:  No  Homicidal Thoughts:  No  Memory:  Immediate;   Fair Recent;   Fair Remote;   Fair  Judgement:  Fair  Insight:  Fair  Psychomotor Activity:  Normal  Concentration:  Concentration: Fair and Attention Span: Fair  Recall:  [employer] of Knowledge: Fair  Language: Fair  Akathisia:  No  Handed:  Right  AIMS (if indicated): not done  Assets:  Communication Skills Desire for Improvement Housing Social Support  ADL's:  Intact  Cognition: WNL  Sleep:  Fair   Screenings: AIMS    Flowsheet Row Video Visit from 11/10/2021 in [facility] Office Visit from 10/01/2021 in [facility] Total Score 5 8      GAD-7    Flowsheet Row Video Visit from 12/20/2021 in [facility] [facility] Office Visit from 06/29/2021 in [facility]  Total GAD-7 Score 9 14      PHQ2-9    Flowsheet Row Video Visit from 12/20/2021 in [facility] Video Visit from 11/10/2021 in [facility] Visit from 10/01/2021 in [facility] Office Visit from 06/29/2021 in [facility]  PHQ-2 Total Score 0 1 2 0  PHQ-9 Total Score -- 7 13 9       Flowsheet Row Video Visit from 12/20/2021 in [facility] Video Visit from 11/10/2021 in [facility] Office Visit from 10/01/2021 in [facility] No Risk Low Risk        Assessment and Plan: Ruth Stout is a 76 year old Caucasian female, married, has a history of GAD, facial tics, presented for medication management.  Patient completed neuropsychological testing per Dr. Sima Matas, currently does not meet criteria for ADHD rather likely has underlying mood disorder unspecified, chronic PTSD, will benefit from the following plan.  Plan  GAD-stable Discontinue Wellbutrin XL 150 mg-noncompliant. Reduce amitriptyline to 100 mg p.o. nightly-patient believes higher dosage is likely making her facial tics worse. Referral for CBT-patient provided community resources.  Mood disorder unspecified-unstable Rule out bipolar disorder type II, patient currently denies any manic or hypomanic symptoms.  However patient to keep a log and bring it to next session. Referral for CBT  Attention and concentration deficit-unstable Patient is not interested in increasing any of the medication or addition of another medication. Referral for CBT Reviewed neuropsychological testing per Dr. Sima Matas as noted above-11/29/2021.  Chronic PTSD-stable Referral for CBT  Patient advised to follow up with neurology for further management of facial tics.  Follow-up in clinic as needed.   Collaboration of Care: Collaboration of Care: Referral or follow-up with counselor/therapist [facility] encouraged to establish  care with therapist.  Patient/Guardian was advised Release of Information must be obtained prior to any record release in order to collaborate their care with an outside provider. Patient/Guardian was advised if they have not already done so to contact the registration department to sign all necessary forms in order for [place] to release information regarding their care.   Consent: Patient/Guardian gives verbal consent for treatment and assignment of benefits for services provided during this visit. Patient/Guardian expressed understanding and agreed to proceed.   This note was generated in part or whole with voice recognition software. Voice recognition is usually quite accurate but there are transcription  errors that can and very often do occur. I apologize for any typographical errors that were not detected and corrected.      Ursula Alert, MD 12/20/2021, 2:20 PM

## 2021-12-20 NOTE — Patient Instructions (Signed)
[email or web address]  [email or web address]  [facility] [phone]   [facility] - [place] [phone]  [facility] - [phone]

## 2022-03-08 ENCOUNTER — Ambulatory Visit: Payer: Medicare Other | Admitting: Psychology

## 2022-03-16 ENCOUNTER — Ambulatory Visit: Payer: Medicare Other | Admitting: Psychology

## 2022-07-05 ENCOUNTER — Other Ambulatory Visit: Payer: Self-pay | Admitting: Orthopaedic Surgery

## 2022-07-05 DIAGNOSIS — M25511 Pain in right shoulder: Secondary | ICD-10-CM

## 2022-07-26 ENCOUNTER — Ambulatory Visit
Payer: Medicare Other | Attending: Student in an Organized Health Care Education/Training Program | Admitting: Student in an Organized Health Care Education/Training Program

## 2022-07-26 ENCOUNTER — Other Ambulatory Visit: Payer: Medicare Other

## 2022-07-26 ENCOUNTER — Encounter: Payer: Self-pay | Admitting: Student in an Organized Health Care Education/Training Program

## 2022-07-26 VITALS — BP 139/86 | HR 88 | Temp 98.6°F | Resp 16 | Ht 61.0 in | Wt 170.0 lb

## 2022-07-26 DIAGNOSIS — Z79891 Long term (current) use of opiate analgesic: Secondary | ICD-10-CM | POA: Insufficient documentation

## 2022-07-26 DIAGNOSIS — M75101 Unspecified rotator cuff tear or rupture of right shoulder, not specified as traumatic: Secondary | ICD-10-CM | POA: Diagnosis present

## 2022-07-26 DIAGNOSIS — E1140 Type 2 diabetes mellitus with diabetic neuropathy, unspecified: Secondary | ICD-10-CM | POA: Diagnosis present

## 2022-07-26 DIAGNOSIS — M19011 Primary osteoarthritis, right shoulder: Secondary | ICD-10-CM | POA: Insufficient documentation

## 2022-07-26 DIAGNOSIS — G894 Chronic pain syndrome: Secondary | ICD-10-CM | POA: Insufficient documentation

## 2022-07-26 DIAGNOSIS — M48061 Spinal stenosis, lumbar region without neurogenic claudication: Secondary | ICD-10-CM | POA: Insufficient documentation

## 2022-07-26 DIAGNOSIS — M47816 Spondylosis without myelopathy or radiculopathy, lumbar region: Secondary | ICD-10-CM | POA: Diagnosis present

## 2022-07-26 DIAGNOSIS — M12811 Other specific arthropathies, not elsewhere classified, right shoulder: Secondary | ICD-10-CM | POA: Insufficient documentation

## 2022-07-26 NOTE — Progress Notes (Signed)
Safety precautions to be maintained throughout the outpatient stay will include: orient to surroundings, keep bed in low position, maintain call bell within reach at all times, provide assistance with transfer out of bed and ambulation.  

## 2022-07-26 NOTE — Progress Notes (Signed)
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 today Achilles: deferred today Plantar: deferred today  Palpation: No palpable anomalies  Palpation: No palpable anomalies    Assessment  Primary Diagnosis & Pertinent Problem List: The primary encounter diagnosis was Right rotator cuff tear arthropathy. Diagnoses of Primary osteoarthritis of right shoulder, Foraminal stenosis of lumbar region, Lumbar facet arthropathy, Encounter for long-term opiate analgesic use, Chronic pain syndrome, and Chronic painful diabetic neuropathy were also pertinent to this visit.  Visit Diagnosis (New problems to examiner): 1. Right rotator cuff tear arthropathy   2. Primary osteoarthritis of right shoulder   3. Foraminal stenosis of lumbar region   4. Lumbar facet arthropathy   5. Encounter for long-term opiate analgesic use   6. Chronic pain syndrome   7. Chronic painful diabetic  neuropathy    Plan of Care (Initial workup plan)  Note: Ms. Thier was reminded that as per protocol, today's visit has been an evaluation only. We have not taken over the patient's controlled substance management.  1. Right rotator cuff tear arthropathy - SHOULDER INJECTION; Future  2. Primary osteoarthritis of right shoulder - SHOULDER INJECTION; Future  3. Foraminal stenosis of lumbar region  4. Lumbar facet arthropathy  5. Encounter for long-term opiate analgesic use  6. Chronic pain syndrome - Compliance Drug Analysis, Ur - SHOULDER INJECTION; Future - NEUROLYSIS; Future  7. Chronic painful diabetic neuropathy - NEUROLYSIS; Future    Lab Orders         Compliance Drug Analysis, Ur       Procedure Orders         SHOULDER INJECTION         NEUROLYSIS      Interventional management options: Ms. Sandiford was informed that there is no guarantee that she would be a candidate for interventional therapies. The decision will be based on the results of diagnostic studies, as well as Ms. Strickland's risk profile.  Procedure(s) under consideration:  Anterior shoulder joint injection Suprascapular and axillary  nerve block Diagnostic lumbar facet medial branch nerve blocks Qutenza    Provider-requested follow-up: Return in about 15 days (around 08/10/2022) for Right shoulder injection + Qutenza, in clinic [facility].  Future Appointments  Date Time Provider Department Center  08/10/2022  9:20 AM Edward Jolly, MD [facility] None  09/29/2022  2:00 PM [facility] 1 [facility] [address] [facility]    Duration of encounter: .  Total time on encounter, as per AMA guidelines included both the face-to-face and non-face-to-face time personally spent by the physician and/or other qualified health care professional(s) on the day of the encounter (includes time in activities that require the physician or other qualified health care professional and does not include time in activities normally  performed by clinical staff). Physician's time may include the following activities when performed: Preparing to see the patient (e.g., pre-charting review of records, searching for previously ordered imaging, lab work, and nerve conduction tests) Review of prior analgesic pharmacotherapies. Reviewing PMP Interpreting ordered tests (e.g., lab work, imaging, nerve conduction tests) Performing post-procedure evaluations, including interpretation of diagnostic procedures Obtaining and/or reviewing separately obtained history Performing a medically appropriate examination and/or evaluation Counseling and educating the patient/family/caregiver Ordering medications, tests, or procedures Referring and communicating with other health care professionals (when not separately reported) Documenting clinical information in the electronic or other health record Independently interpreting results (not separately reported) and communicating results to the patient/ family/caregiver Care coordination (not separately reported)  Note by: Edward Jolly, MD (TTS technology used. I apologize for any typographical errors that were not detected and corrected.) Date: 07/26/2022; Time: 10:24 AM

## 2022-07-30 LAB — COMPLIANCE DRUG ANALYSIS, UR

## 2022-08-10 ENCOUNTER — Encounter: Payer: Self-pay | Admitting: Student in an Organized Health Care Education/Training Program

## 2022-08-10 ENCOUNTER — Ambulatory Visit
Admission: RE | Admit: 2022-08-10 | Discharge: 2022-08-10 | Disposition: A | Discharge status: Home / Self Care | Payer: Medicare Other | Source: Ambulatory Visit | Attending: Student in an Organized Health Care Education/Training Program | Admitting: Student in an Organized Health Care Education/Training Program

## 2022-08-10 ENCOUNTER — Ambulatory Visit
Payer: Medicare Other | Attending: Student in an Organized Health Care Education/Training Program | Admitting: Student in an Organized Health Care Education/Training Program

## 2022-08-10 ENCOUNTER — Telehealth: Payer: Self-pay | Admitting: Student in an Organized Health Care Education/Training Program

## 2022-08-10 VITALS — BP 123/67 | HR 72 | Temp 97.9°F | Resp 17 | Ht 61.0 in | Wt 170.0 lb

## 2022-08-10 DIAGNOSIS — M19011 Primary osteoarthritis, right shoulder: Secondary | ICD-10-CM | POA: Insufficient documentation

## 2022-08-10 DIAGNOSIS — G894 Chronic pain syndrome: Secondary | ICD-10-CM | POA: Diagnosis present

## 2022-08-10 DIAGNOSIS — E1140 Type 2 diabetes mellitus with diabetic neuropathy, unspecified: Secondary | ICD-10-CM | POA: Diagnosis present

## 2022-08-10 DIAGNOSIS — M19012 Primary osteoarthritis, left shoulder: Secondary | ICD-10-CM | POA: Diagnosis present

## 2022-08-10 MED ORDER — CAPSAICIN-CLEANSING GEL 8 % EX KIT
4.0000 | PACK | Freq: Once | CUTANEOUS | Status: AC
  Administered 2022-08-10: 4 via TOPICAL
  Filled 2022-08-10: qty 4

## 2022-08-10 MED ORDER — IOHEXOL 180 MG/ML  SOLN
10.0000 mL | Freq: Once | INTRAMUSCULAR | Status: AC
  Administered 2022-08-10: 10 mL via INTRA_ARTICULAR
  Filled 2022-08-10: qty 20

## 2022-08-10 MED ORDER — METHYLPREDNISOLONE ACETATE 40 MG/ML IJ SUSP
40.0000 mg | Freq: Once | INTRAMUSCULAR | Status: AC
  Administered 2022-08-10: 40 mg via INTRA_ARTICULAR
  Filled 2022-08-10: qty 1

## 2022-08-10 MED ORDER — OXYCODONE-ACETAMINOPHEN 5-325 MG PO TABS
1.0000 | ORAL_TABLET | Freq: Every day | ORAL | 0 refills | Status: DC | PRN
Start: 2022-08-10 — End: 2022-09-07

## 2022-08-10 MED ORDER — LIDOCAINE HCL 2 % IJ SOLN
20.0000 mL | Freq: Once | INTRAMUSCULAR | Status: AC
  Administered 2022-08-10: 200 mg
  Filled 2022-08-10: qty 20

## 2022-08-10 MED ORDER — ROPIVACAINE HCL 2 MG/ML IJ SOLN
4.0000 mL | Freq: Once | INTRAMUSCULAR | Status: AC
  Administered 2022-08-10: 4 mL via INTRA_ARTICULAR
  Filled 2022-08-10: qty 20

## 2022-08-10 NOTE — Progress Notes (Signed)
Safety precautions to be maintained throughout the outpatient stay will include: orient to surroundings, keep bed in low position, maintain call bell within reach at all times, provide assistance with transfer out of bed and ambulation.  

## 2022-08-10 NOTE — Progress Notes (Signed)
PROVIDER NOTE: Interpretation of information contained herein should be left to medically-trained personnel. Specific patient instructions are provided elsewhere under "Patient Instructions" section of medical record. This document was created in part using STT-dictation technology, any transcriptional errors that may result from this process are unintentional.  Patient: Ruth Stout Type: Established DOB: 1946/03/18 MRN: [id number] PCP: Enid Baas, MD  Service: Procedure DOS: 08/10/2022 Setting: Ambulatory Location: Ambulatory outpatient facility Delivery: Face-to-face Provider: Edward Jolly, MD Specialty: Interventional Pain Management Specialty designation: 09 Location: Outpatient facility Ref. Prov.: Edward Jolly, MD       Interventional Therapy   Interventional Treatment:           Type: Qutenza Neurolysis #1  Laterality:  Bilateral Area treated: Feet Imaging Guidance: None Anesthesia/analgesia/anxiolysis/sedation: None required Medication (Right): Qutenza (capsaicin 8%) topical system Medication (Left): Qutenza (capsaicin 8%) topical system Date: 08/10/2022 Performed by: Edward Jolly, MD Rationale (medical necessity): procedure needed and proper for the treatment of Ruth Stout's medical symptoms and needs. Indication: Painful diabetic peripheral neuralgia (DPN) (ICD-10-CM:E11.40) severe enough to impact quality of life or function.  NAS-11 Pain score:   Pre-procedure: 9 /10   Post-procedure: 9 /10     Position / Prep / Materials:  Position: Supine  Materials: Qutenza Kit  Pre-op H&P Assessment:  Ruth Stout is a 77 y.o. (year old), female patient, seen today for interventional treatment. She  has a past surgical history that includes partial hysterectomy; Appendectomy; Tonsillectomy; Wrist surgery; and tmj implants. Ms. Inga has a current medication list which includes the following prescription(s): amitriptyline, bisacodyl, coenzyme q10, dss, fluticasone,  glimepiride, lantus solostar, insulin lispro, levothyroxine, losartan, oxycodone-acetaminophen, simvastatin, linzess, and semaglutide(0.25 or 0.5mg /dos), and the following Facility-Administered Medications: capsaicin topical system. Her primarily concern today is the Shoulder Pain (right)  Initial Vital Signs:  Pulse/HCG Rate: (!) 131ECG Heart Rate: 88 Temp: (!) 97.2 F (36.2 C) Resp: (!) 21 BP: (!) 148/96 SpO2: 100 %  BMI: Estimated body mass index is 32.12 kg/m as calculated from the following:   Height as of this encounter: 5\' 1"  (1.549 m).   Weight as of this encounter: 170 lb (77.1 kg).  Risk Assessment: Allergies: Reviewed. She is allergic to cephalexin, clarithromycin, codeine, erythromycin base, levofloxacin in d5w, other, penicillins, and sulfa antibiotics.  Allergy Precautions: None required Coagulopathies: Reviewed. None identified.  Blood-thinner therapy: None at this time Active Infection(s): Reviewed. None identified. Ruth Stout is afebrile  Site Confirmation: Ruth Stout was asked to confirm the procedure and laterality before marking the site Procedure checklist: Completed Consent: Before the procedure and under the influence of no sedative(s), amnesic(s), or anxiolytics, the patient was informed of the treatment options, risks and possible complications. To fulfill our ethical and legal obligations, as recommended by the American Medical Association's Code of Ethics, I have informed the patient of my clinical impression; the nature and purpose of the treatment or procedure; the risks, benefits, and possible complications of the intervention; the alternatives, including doing nothing; the risk(s) and benefit(s) of the alternative treatment(s) or procedure(s); and the risk(s) and benefit(s) of doing nothing. The patient was provided information about the general risks and possible complications associated with the procedure. These may include, but are not limited to: failure  to achieve desired goals, infection, bleeding, organ or nerve damage, allergic reactions, paralysis, and death. In addition, the patient was informed of those risks and complications associated to the procedure, such as failure to decrease pain; infection; bleeding; organ or nerve damage with subsequent damage to sensory,  motor, and/or autonomic systems, resulting in permanent pain, numbness, and/or weakness of one or several areas of the body; allergic reactions; (i.e.: anaphylactic reaction); and/or death. Furthermore, the patient was informed of those risks and complications associated with the medications. These include, but are not limited to: allergic reactions (i.e.: anaphylactic or anaphylactoid reaction(s)); adrenal axis suppression; blood sugar elevation that in diabetics may result in ketoacidosis or comma; water retention that in patients with history of congestive heart failure may result in shortness of breath, pulmonary edema, and decompensation with resultant heart failure; weight gain; swelling or edema; medication-induced neural toxicity; particulate matter embolism and blood vessel occlusion with resultant organ, and/or nervous system infarction; and/or aseptic necrosis of one or more joints. Finally, the patient was informed that Medicine is not an exact science; therefore, there is also the possibility of unforeseen or unpredictable risks and/or possible complications that may result in a catastrophic outcome. The patient indicated having understood very clearly. We have given the patient no guarantees and we have made no promises. Enough time was given to the patient to ask questions, all of which were answered to the patient's satisfaction. Ruth Stout has indicated that she wanted to continue with the procedure. Attestation: I, the ordering provider, attest that I have discussed with the patient the benefits, risks, side-effects, alternatives, likelihood of achieving goals, and potential  problems during recovery for the procedure that I have provided informed consent. Date  Time: 08/10/2022  9:05 AM  Pre-Procedure Preparation:  Monitoring: As per clinic protocol. Respiration, ETCO2, SpO2, BP, heart rate and rhythm monitor placed and checked for adequate function Safety Precautions: Patient was assessed for positional comfort and pressure points before starting the procedure. Time-out: I initiated and conducted the "Time-out" before starting the procedure, as per protocol. The patient was asked to participate by confirming the accuracy of the "Time Out" information. Verification of the correct person, site, and procedure were performed and confirmed by me, the nursing staff, and the patient. "Time-out" conducted as per Joint Commission's Universal Protocol (UP.01.01.01). Time: 1001 Start Time: 1001 hrs.  Description/Narrative of Procedure:          Region: Distal lower extremity Target Area: Sensory peripheral nerves affected by diabetic peripheral neuropathy Site: Feet Approach: Percutaneous  No./Series: Not applicable  Type: Percutaneous  Purpose: Therapeutic  Region: Distal lower extremities  Start Time: 1001 hrs.  Description of the Procedure: Protocol guidelines were followed. The patient was assisted into a comfortable position.  Informed consent was obtained in the patient monitored in the usual manner.  All questions were answered prior to the procedure.  They Qutenza patches were applied to the affected area and then covered with the wrap.  The Patient was kept under observation until the treatment was completed.  The patches were removed and the treated area was inspected.  Vitals:   08/10/22 0944 08/10/22 0949 08/10/22 0954 08/10/22 1107  BP: (!) 78/63 106/69 133/69 123/67  Pulse:    72  Resp: (!) 21 11 15 17   Temp:    97.9 F (36.6 C)  TempSrc:    Temporal  SpO2: 98% 100% 96% 94%  Weight:      Height:         End Time: 1057 hrs.  Imaging Guidance:           Type of Imaging Technique: None used Indication(s): N/A Exposure Time: No patient exposure Contrast: None used. Fluoroscopic Guidance: N/A Ultrasound Guidance: N/A Interpretation: N/A  Post-operative Assessment:  Post-procedure Vital  Signs:  Pulse/HCG Rate: 7280 Temp: 97.9 F (36.6 C) Resp: 17 BP: 123/67 SpO2: 94 %  EBL: None  Complications: No immediate post-treatment complications observed by team, or reported by patient.  Note: The patient tolerated the entire procedure well. A repeat set of vitals were taken after the procedure and the patient was kept under observation following institutional policy, for this type of procedure. Post-procedural neurological assessment was performed, showing return to baseline, prior to discharge. The patient was provided with post-procedure discharge instructions, including a section on how to identify potential problems. Should any problems arise concerning this procedure, the patient was given instructions to immediately contact us, at any time, without hesitation. In any case, we plan to contact the patient by telephone for a follow-up status report regarding this interventional procedure.  Comments:  No additional relevant information.  Plan of Care (POC)  Orders:  Orders Placed This Encounter  Procedures   DG PAIN CLINIC C-ARM 1-60 MIN NO REPORT    Intraoperative interpretation by procedural physician at [facility].    Standing Status:   Standing    Number of Occurrences:   1    Order Specific Question:   Reason for exam:    Answer:   Assistance in needle guidance and placement for procedures requiring needle placement in or near specific anatomical locations not easily accessible without such assistance.     Medications ordered for procedure: Meds ordered this encounter  Medications   lidocaine (XYLOCAINE) 2 % (with pres) injection 400 mg   iohexol (OMNIPAQUE) 180 MG/ML injection 10 mL    Must be  Myelogram-compatible. If not available, you may substitute with a water-soluble, non-ionic, hypoallergenic, myelogram-compatible radiological contrast medium.   methylPREDNISolone acetate (DEPO-MEDROL) injection 40 mg   ropivacaine (PF) 2 mg/mL (0.2%) (NAROPIN) injection 4 mL   capsaicin topical system 8 % patch 4 patch   oxyCODONE-acetaminophen (PERCOCET) 5-325 MG tablet    Sig: Take 1 tablet by mouth daily as needed for severe pain. Must last 30 days.    Dispense:  30 tablet    Refill:  0    Chronic Pain: STOP Act (Not applicable) Fill 1 day early if closed on refill date. Avoid benzodiazepines within 8 hours of opioids  PMP checked and reviewed.  Patient to sign controlled substance agreement  Medications administered: We administered lidocaine, iohexol, methylPREDNISolone acetate, and ropivacaine (PF) 2 mg/mL (0.2%).  See the medical record for exact dosing, route, and time of administration.  Follow-up plan:   Return in about 4 weeks (around 09/07/2022), or PPE F2F.      Recent Visits Date Type Provider Dept  07/26/22 Office Visit Edward Jolly, MD [facility]  Showing recent visits within past 90 days and meeting all other requirements Today's Visits Date Type Provider Dept  08/10/22 Procedure visit Edward Jolly, MD [facility]  Showing today's visits and meeting all other requirements Future Appointments Date Type Provider Dept  09/07/22 Appointment Edward Jolly, MD [facility]  Showing future appointments within next 90 days and meeting all other requirements  Disposition: Discharge home  Discharge (Date  Time): 08/10/2022; 1106 hrs.   Primary Care Physician: Enid Baas, MD Location: [facility] Note by: Edward Jolly, MD (TTS technology used. I apologize for any typographical errors that were not detected and corrected.) Date: 08/10/2022; Time: 12:13 PM  Disclaimer:  Medicine is not an Visual merchandiser. The  only guarantee in medicine is that nothing is guaranteed. It  is important to note that the decision to proceed with this intervention was based on the information collected from the patient. The Data and conclusions were drawn from the patient's questionnaire, the interview, and the physical examination. Because the information was provided in large part by the patient, it cannot be guaranteed that it has not been purposely or unconsciously manipulated. Every effort has been made to obtain as much relevant data as possible for this evaluation. It is important to note that the conclusions that lead to this procedure are derived in large part from the available data. Always take into account that the treatment will also be dependent on availability of resources and existing treatment guidelines, considered by other Pain Management Practitioners as being common knowledge and practice, at the time of the intervention. For Medico-Legal purposes, it is also important to point out that variation in procedural techniques and pharmacological choices are the acceptable norm. The indications, contraindications, technique, and results of the above procedure should only be interpreted and judged by a Board-Certified Interventional Pain Specialist with extensive familiarity and expertise in the same exact procedure and technique.

## 2022-08-10 NOTE — Telephone Encounter (Signed)
Called patient and she states her fe et are burning. Instructed her to put ice on feet and to make sure they are washed. Will call patient back in the am to check on her

## 2022-08-10 NOTE — Telephone Encounter (Signed)
Patient is calling back to speak with a nurse. She had a procedure today and is in a lot of pain.

## 2022-08-10 NOTE — Progress Notes (Signed)
PROVIDER NOTE: Interpretation of information contained herein should be left to medically-trained personnel. Specific patient instructions are provided elsewhere under "Patient Instructions" section of medical record. This document was created in part using STT-dictation technology, any transcriptional errors that may result from this process are unintentional.  Patient: Ruth Stout Type: Established DOB: 1945-09-03 MRN: [id number] PCP: Enid Baas, MD  Service: Procedure DOS: 08/10/2022 Setting: Ambulatory Location: Ambulatory outpatient facility Delivery: Face-to-face Provider: Edward Jolly, MD Specialty: Interventional Pain Management Specialty designation: 09 Location: Outpatient facility Ref. Prov.: Edward Jolly, MD       Interventional Therapy   Procedure: Glenohumeral Joint (shoulder) Injection #1  Laterality: Right (-RT)  Level: Shoulder   Imaging: Fluoroscopy-guided         Anesthesia: Local anesthesia (1-2% Lidocaine) DOS: 08/10/2022  Performed by: Edward Jolly, MD  Purpose: Diagnostic/Therapeutic Indications: Shoulder pain severe enough to impact quality of life or function. Rationale (medical necessity): procedure needed and proper for the diagnosis and/or treatment of Ms. Wittmeyer's medical symptoms and needs. 1. Localized primary osteoarthritis of shoulder regions, bilateral   2. Chronic pain syndrome   3. Chronic painful diabetic neuropathy (HCC)    NAS-11 Pain score:   Pre-procedure: 9 /10   Post-procedure: 9 /10      Target: Glenohumeral Joint (shoulder) Location: Intra-articular  Region: Entire Shoulder Area Type of procedure: Percutaneous joint injection   Position  Prep  Materials:  Position: Supine Prep solution: DuraPrep (Iodine Povacrylex [0.7% available iodine] and Isopropyl Alcohol, 74% w/w) Prep Area: Entire shoulder Area Materials:  Tray: Block Needle(s):  Type: Spinal  Gauge (G): 22  Length: 3.5-in    Pre-op H&P Assessment:   Ms. Dace is a 77 y.o. (year old), female patient, seen today for interventional treatment. She  has a past surgical history that includes partial hysterectomy; Appendectomy; Tonsillectomy; Wrist surgery; and tmj implants. Ms. Heinzelman has a current medication list which includes the following prescription(s): amitriptyline, bisacodyl, coenzyme q10, dss, fluticasone, glimepiride, lantus solostar, insulin lispro, levothyroxine, losartan, oxycodone-acetaminophen, simvastatin, linzess, and semaglutide(0.25 or 0.5mg /dos), and the following Facility-Administered Medications: capsaicin topical system. Her primarily concern today is the Shoulder Pain (right)  Initial Vital Signs:  Pulse/HCG Rate: (!) 131ECG Heart Rate: 88 Temp: (!) 97.2 F (36.2 C) Resp: (!) 21 BP: (!) 148/96 SpO2: 100 %  BMI: Estimated body mass index is 32.12 kg/m as calculated from the following:   Height as of this encounter: 5\' 1"  (1.549 m).   Weight as of this encounter: 170 lb (77.1 kg).  Risk Assessment: Allergies: Reviewed. She is allergic to cephalexin, clarithromycin, codeine, erythromycin base, levofloxacin in d5w, other, penicillins, and sulfa antibiotics.  Allergy Precautions: None required Coagulopathies: Reviewed. None identified.  Blood-thinner therapy: None at this time Active Infection(s): Reviewed. None identified. Ms. Chinchilla is afebrile  Site Confirmation: Ms. Mangen was asked to confirm the procedure and laterality before marking the site Procedure checklist: Completed Consent: Before the procedure and under the influence of no sedative(s), amnesic(s), or anxiolytics, the patient was informed of the treatment options, risks and possible complications. To fulfill our ethical and legal obligations, as recommended by the American Medical Association's Code of Ethics, I have informed the patient of my clinical impression; the nature and purpose of the treatment or procedure; the risks, benefits, and possible  complications of the intervention; the alternatives, including doing nothing; the risk(s) and benefit(s) of the alternative treatment(s) or procedure(s); and the risk(s) and benefit(s) of doing nothing. The patient was provided information about the general  risks and possible complications associated with the procedure. These may include, but are not limited to: failure to achieve desired goals, infection, bleeding, organ or nerve damage, allergic reactions, paralysis, and death. In addition, the patient was informed of those risks and complications associated to the procedure, such as failure to decrease pain; infection; bleeding; organ or nerve damage with subsequent damage to sensory, motor, and/or autonomic systems, resulting in permanent pain, numbness, and/or weakness of one or several areas of the body; allergic reactions; (i.e.: anaphylactic reaction); and/or death. Furthermore, the patient was informed of those risks and complications associated with the medications. These include, but are not limited to: allergic reactions (i.e.: anaphylactic or anaphylactoid reaction(s)); adrenal axis suppression; blood sugar elevation that in diabetics may result in ketoacidosis or comma; water retention that in patients with history of congestive heart failure may result in shortness of breath, pulmonary edema, and decompensation with resultant heart failure; weight gain; swelling or edema; medication-induced neural toxicity; particulate matter embolism and blood vessel occlusion with resultant organ, and/or nervous system infarction; and/or aseptic necrosis of one or more joints. Finally, the patient was informed that Medicine is not an exact science; therefore, there is also the possibility of unforeseen or unpredictable risks and/or possible complications that may result in a catastrophic outcome. The patient indicated having understood very clearly. We have given the patient no guarantees and we have made no  promises. Enough time was given to the patient to ask questions, all of which were answered to the patient's satisfaction. Ms. Goebel has indicated that she wanted to continue with the procedure. Attestation: I, the ordering provider, attest that I have discussed with the patient the benefits, risks, side-effects, alternatives, likelihood of achieving goals, and potential problems during recovery for the procedure that I have provided informed consent. Date  Time: 08/10/2022  9:05 AM   Imaging Guidance (Non-Spinal):          Type of Imaging Technique: Fluoroscopy Guidance (Non-Spinal) Indication(s): Assistance in needle guidance and placement for procedures requiring needle placement in or near specific anatomical locations not easily accessible without such assistance. Exposure Time: Please see nurses notes. Contrast: Before injecting any contrast, we confirmed that the patient did not have an allergy to iodine, shellfish, or radiological contrast. Once satisfactory needle placement was completed at the desired level, radiological contrast was injected. Contrast injected under live fluoroscopy. No contrast complications. See chart for type and volume of contrast used. Fluoroscopic Guidance: I was personally present during the use of fluoroscopy. "Tunnel Vision Technique" used to obtain the best possible view of the target area. Parallax error corrected before commencing the procedure. "Direction-depth-direction" technique used to introduce the needle under continuous pulsed fluoroscopy. Once target was reached, antero-posterior, oblique, and lateral fluoroscopic projection used confirm needle placement in all planes. Images permanently stored in EMR. Interpretation: I personally interpreted the imaging intraoperatively. Adequate needle placement confirmed in multiple planes. Appropriate spread of contrast into desired area was observed. No evidence of afferent or efferent intravascular uptake. Permanent  images saved into the patient's record.  Pre-Procedure Preparation:  Monitoring: As per clinic protocol. Respiration, ETCO2, SpO2, BP, heart rate and rhythm monitor placed and checked for adequate function Safety Precautions: Patient was assessed for positional comfort and pressure points before starting the procedure. Time-out: I initiated and conducted the "Time-out" before starting the procedure, as per protocol. The patient was asked to participate by confirming the accuracy of the "Time Out" information. Verification of the correct person, site, and procedure were  performed and confirmed by me, the nursing staff, and the patient. "Time-out" conducted as per Joint Commission's Universal Protocol (UP.01.01.01). Time: 1001 Start Time: 1001 hrs.  Description  Narrative of Procedure:          Rationale (medical necessity): procedure needed and proper for the diagnosis and/or treatment of the patient's medical symptoms and needs. Procedural Technique Safety Precautions: Aspiration looking for blood return was conducted prior to all injections. At no point did we inject any substances, as a needle was being advanced. No attempts were made at seeking any paresthesias. Safe injection practices and needle disposal techniques used. Medications properly checked for expiration dates. SDV (single dose vial) medications used. Description of the Procedure: Protocol guidelines were followed. The patient was placed in position over the procedure table. The target area was identified and the area prepped in the usual manner. Skin & deeper tissues infiltrated with local anesthetic. Appropriate amount of time allowed to pass for local anesthetics to take effect. The procedure needles were then advanced to the target area. Proper needle placement secured. Negative aspiration confirmed. Solution injected in intermittent fashion, asking for systemic symptoms every 0.5cc of injectate. The needles were then removed and the  area cleansed, making sure to leave some of the prepping solution back to take advantage of its long term bactericidal properties.  5 cc solution made of 4 cc of 0.2% ropivacaine, 1 cc of methylprednisolone, 40 mg/cc.  Injected into the right glenohumeral joint after contrast confirmation.              Vitals:   08/10/22 0944 08/10/22 0949 08/10/22 0954 08/10/22 1107  BP: (!) 78/63 106/69 133/69 123/67  Pulse:    72  Resp: (!) 21 11 15 17   Temp:    97.9 F (36.6 C)  TempSrc:    Temporal  SpO2: 98% 100% 96% 94%  Weight:      Height:         Start Time: 1001 hrs. End Time: 1057 hrs.  Antibiotic Prophylaxis:   Anti-infectives (From admission, onward)    None      Indication(s): None identified  Post-operative Assessment:  Post-procedure Vital Signs:  Pulse/HCG Rate: 7280 Temp: 97.9 F (36.6 C) Resp: 17 BP: 123/67 SpO2: 94 %  EBL: None  Complications: No immediate post-treatment complications observed by team, or reported by patient.  Note: The patient tolerated the entire procedure well. A repeat set of vitals were taken after the procedure and the patient was kept under observation following institutional policy, for this type of procedure. Post-procedural neurological assessment was performed, showing return to baseline, prior to discharge. The patient was provided with post-procedure discharge instructions, including a section on how to identify potential problems. Should any problems arise concerning this procedure, the patient was given instructions to immediately contact us, at any time, without hesitation. In any case, we plan to contact the patient by telephone for a follow-up status report regarding this interventional procedure.  Comments:  No additional relevant information.  Plan of Care (POC)  Orders:  Orders Placed This Encounter  Procedures   DG PAIN CLINIC C-ARM 1-60 MIN NO REPORT    Intraoperative interpretation by procedural physician at  [facility].    Standing Status:   Standing    Number of Occurrences:   1    Order Specific Question:   Reason for exam:    Answer:   Assistance in needle guidance and placement for procedures requiring needle placement in or near  specific anatomical locations not easily accessible without such assistance.    Medications ordered for procedure: Meds ordered this encounter  Medications   lidocaine (XYLOCAINE) 2 % (with pres) injection 400 mg   iohexol (OMNIPAQUE) 180 MG/ML injection 10 mL    Must be Myelogram-compatible. If not available, you may substitute with a water-soluble, non-ionic, hypoallergenic, myelogram-compatible radiological contrast medium.   methylPREDNISolone acetate (DEPO-MEDROL) injection 40 mg   ropivacaine (PF) 2 mg/mL (0.2%) (NAROPIN) injection 4 mL   capsaicin topical system 8 % patch 4 patch   oxyCODONE-acetaminophen (PERCOCET) 5-325 MG tablet    Sig: Take 1 tablet by mouth daily as needed for severe pain. Must last 30 days.    Dispense:  30 tablet    Refill:  0    Chronic Pain: STOP Act (Not applicable) Fill 1 day early if closed on refill date. Avoid benzodiazepines within 8 hours of opioids   Medications administered: We administered lidocaine, iohexol, methylPREDNISolone acetate, and ropivacaine (PF) 2 mg/mL (0.2%).  See the medical record for exact dosing, route, and time of administration.  Follow-up plan:   Return in about 4 weeks (around 09/07/2022), or PPE F2F.      Recent Visits Date Type Provider Dept  07/26/22 Office Visit Edward Jolly, MD [facility]  Showing recent visits within past 90 days and meeting all other requirements Today's Visits Date Type Provider Dept  08/10/22 Procedure visit Edward Jolly, MD [facility]  Showing today's visits and meeting all other requirements Future Appointments Date Type Provider Dept  09/07/22 Appointment Edward Jolly, MD [facility]  Showing future appointments  within next 90 days and meeting all other requirements  Disposition: Discharge home  Discharge (Date  Time): 08/10/2022; 1106 hrs.   Primary Care Physician: Enid Baas, MD Location: [facility] Note by: Edward Jolly, MD (TTS technology used. I apologize for any typographical errors that were not detected and corrected.) Date: 08/10/2022; Time: 12:11 PM  Disclaimer:  Medicine is not an Visual merchandiser. The only guarantee in medicine is that nothing is guaranteed. It is important to note that the decision to proceed with this intervention was based on the information collected from the patient. The Data and conclusions were drawn from the patient's questionnaire, the interview, and the physical examination. Because the information was provided in large part by the patient, it cannot be guaranteed that it has not been purposely or unconsciously manipulated. Every effort has been made to obtain as much relevant data as possible for this evaluation. It is important to note that the conclusions that lead to this procedure are derived in large part from the available data. Always take into account that the treatment will also be dependent on availability of resources and existing treatment guidelines, considered by other Pain Management Practitioners as being common knowledge and practice, at the time of the intervention. For Medico-Legal purposes, it is also important to point out that variation in procedural techniques and pharmacological choices are the acceptable norm. The indications, contraindications, technique, and results of the above procedure should only be interpreted and judged by a Board-Certified Interventional Pain Specialist with extensive familiarity and expertise in the same exact procedure and technique.

## 2022-08-10 NOTE — Patient Instructions (Addendum)
\[id number] Patches What is this medication? CAPSAICIN (cap SAY sin) relieves minor pain in your muscles and joints. It works by making your skin feel warm or cool, which blocks pain signals going to the brain. This medicine may be used for other purposes; ask your health care provider or pharmacist if you have questions. COMMON BRAND NAME(S): Qutenza What should I tell my care team before I take this medication? They need to know if you have any of these conditions: Broken or irritated skin High blood pressure History of heart attack or stroke An unusual or allergic reaction to capsaicin, hot peppers, other medications, foods, dyes, or preservatives Pregnant or trying to get pregnant Breast-feeding How should I use this medication? This medication is for external use only. It is applied by your care team in a hospital or clinic setting. Talk to your care team about the use of this medication in children. Special care may be needed. Overdosage: If you think you have taken too much of this medicine contact a poison control center or emergency room at once. NOTE: This medicine is only for you. Do not share this medicine with others. What if I miss a dose? This does not apply. What may interact with this medication? Interactions are not expected. Do not use any other skin products on the affected area without asking your care team. This list may not describe all possible interactions. Give your health care provider a list of all the medicines, herbs, non-prescription drugs, or dietary supplements you use. Also tell them if you smoke, drink alcohol, or use illegal drugs. Some items may interact with your medicine. What should I watch for while using this medication? Your condition will be monitored carefully while you are receiving this medication. Your blood pressure may go up during the procedure. Do not touch the medication patch during treatment. This medication causes  red, burning skin. You may need pain medication for during and after the procedure. This medication can make you more sensitive to heat for a few days after treatment. Be careful in hot showers or baths. Keep out of the sun. Exercise may make the treated skin feel hotter. Tell your care team if your symptoms do not start to get better or if they get worse. What side effects may I notice from receiving this medication? Side effects that you should report to your care team as soon as possible: Allergic reactions--skin rash, itching, hives, swelling of the face, lips, tongue, or throat Side effects that usually do not require medical attention (report these to your care team if they continue or are bothersome): Mild skin irritation, redness, or dryness This list may not describe all possible side effects. Call your doctor for medical advice about side effects. You may report side effects to FDA at 1-800-FDA-1088. Where should I keep my medication? This medication is given in a hospital or clinic. It will not be stored at home. NOTE: This sheet is a summary. It may not cover all possible information. If you have questions about this medicine, talk to your doctor, pharmacist, or health care provider.  2023 Elsevier/Gold Standard (2020-11-25 00:00:00)  ____________________________________________________________________________________________  Post-Procedure Discharge Instructions  Instructions: Apply ice:  Purpose: This will minimize any swelling and discomfort after procedure.  When: Day of procedure, as soon as you get home. How: Fill a plastic sandwich bag with crushed ice. Cover it with a small towel and apply to injection site. How long: (15 min on, 15 min off) Apply for 15 minutes  then remove x 15 minutes.  Repeat sequence on day of procedure, until you go to bed. Apply heat:  Purpose: To treat any soreness and discomfort from the procedure. When: Starting the next day after the  procedure. How: Apply heat to procedure site starting the day following the procedure. How long: May continue to repeat daily, until discomfort goes away. Food intake: Start with clear liquids (like water) and advance to regular food, as tolerated.  Physical activities: Keep activities to a minimum for the first 8 hours after the procedure. After that, then as tolerated. Driving: If you have received any sedation, be responsible and do not drive. You are not allowed to drive for 24 hours after having sedation. Blood thinner: (Applies only to those taking blood thinners) You may restart your blood thinner 6 hours after your procedure. Insulin: (Applies only to Diabetic patients taking insulin) As soon as you can eat, you may resume your normal dosing schedule. Infection prevention: Keep procedure site clean and dry. Shower daily and clean area with soap and water. Post-procedure Pain Diary: Extremely important that this be done correctly and accurately. Recorded information will be used to determine the next step in treatment. For the purpose of accuracy, follow these rules: Evaluate only the area treated. Do not report or include pain from an untreated area. For the purpose of this evaluation, ignore all other areas of pain, except for the treated area. After your procedure, avoid taking a long nap and attempting to complete the pain diary after you wake up. Instead, set your alarm clock to go off every hour, on the hour, for the initial 8 hours after the procedure. Document the duration of the numbing medicine, and the relief you are getting from it. Do not go to sleep and attempt to complete it later. It will not be accurate. If you received sedation, it is likely that you were given a medication that may cause amnesia. Because of this, completing the diary at a later time may cause the information to be inaccurate. This information is needed to plan your care. Follow-up appointment: Keep your  post-procedure follow-up evaluation appointment after the procedure (usually 2 weeks for most procedures, 6 weeks for radiofrequencies). DO NOT FORGET to bring you pain diary with you.   Expect: (What should I expect to see with my procedure?) From numbing medicine (AKA: Local Anesthetics): Numbness or decrease in pain. You may also experience some weakness, which if present, could last for the duration of the local anesthetic. Onset: Full effect within 15 minutes of injected. Duration: It will depend on the type of local anesthetic used. On the average, 1 to 8 hours.  From steroids (Applies only if steroids were used): Decrease in swelling or inflammation. Once inflammation is improved, relief of the pain will follow. Onset of benefits: Depends on the amount of swelling present. The more swelling, the longer it will take for the benefits to be seen. In some cases, up to 10 days. Duration: Steroids will stay in the system x 2 weeks. Duration of benefits will depend on multiple posibilities including persistent irritating factors. Side-effects: If present, they may typically last 2 weeks (the duration of the steroids). Frequent: Cramps (if they occur, drink Gatorade and take over-the-counter Magnesium 450-500 mg once to twice a day); water retention with temporary weight gain; increases in blood sugar; decreased immune system response; increased appetite. Occasional: Facial flushing (red, warm cheeks); mood swings; menstrual changes. Uncommon: Long-term decrease or suppression of natural hormones;  bone thinning. (These are more common with higher doses or more frequent use. This is why we prefer that our patients avoid having any injection therapies in other practices.)  Very Rare: Severe mood changes; psychosis; aseptic necrosis. From procedure: Some discomfort is to be expected once the numbing medicine wears off. This should be minimal if ice and heat are applied as instructed.  Call if: (When  should I call?) You experience numbness and weakness that gets worse with time, as opposed to wearing off. New onset bowel or bladder incontinence. (Applies only to procedures done in the spine)  Emergency Numbers: Durning business hours (Monday - Thursday, 8:00 AM - 4:00 PM) (Friday, 9:00 AM - 12:00 Noon): [phone] After hours: [phone] NOTE: If you are having a problem and are unable connect with, or to talk to a provider, then go to your nearest urgent care or emergency department. If the problem is serious and urgent, please call 911. ____________________________________________________________________________________________

## 2022-08-11 ENCOUNTER — Telehealth: Payer: Self-pay

## 2022-08-11 NOTE — Telephone Encounter (Signed)
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

## 2022-08-11 NOTE — Telephone Encounter (Signed)
Post procedure follow up.  LM 

## 2022-09-07 ENCOUNTER — Ambulatory Visit
Payer: Medicare Other | Attending: Student in an Organized Health Care Education/Training Program | Admitting: Student in an Organized Health Care Education/Training Program

## 2022-09-07 ENCOUNTER — Encounter: Payer: Self-pay | Admitting: Student in an Organized Health Care Education/Training Program

## 2022-09-07 VITALS — BP 112/68 | HR 84 | Temp 97.3°F | Resp 18 | Ht 61.0 in | Wt 173.0 lb

## 2022-09-07 DIAGNOSIS — E1140 Type 2 diabetes mellitus with diabetic neuropathy, unspecified: Secondary | ICD-10-CM | POA: Insufficient documentation

## 2022-09-07 DIAGNOSIS — M75101 Unspecified rotator cuff tear or rupture of right shoulder, not specified as traumatic: Secondary | ICD-10-CM | POA: Insufficient documentation

## 2022-09-07 DIAGNOSIS — M12811 Other specific arthropathies, not elsewhere classified, right shoulder: Secondary | ICD-10-CM | POA: Diagnosis present

## 2022-09-07 DIAGNOSIS — Z7985 Long-term (current) use of injectable non-insulin antidiabetic drugs: Secondary | ICD-10-CM

## 2022-09-07 DIAGNOSIS — G894 Chronic pain syndrome: Secondary | ICD-10-CM | POA: Insufficient documentation

## 2022-09-07 DIAGNOSIS — M19011 Primary osteoarthritis, right shoulder: Secondary | ICD-10-CM | POA: Diagnosis present

## 2022-09-07 DIAGNOSIS — M19012 Primary osteoarthritis, left shoulder: Secondary | ICD-10-CM | POA: Diagnosis present

## 2022-09-07 MED ORDER — OXYCODONE-ACETAMINOPHEN 5-325 MG PO TABS
1.0000 | ORAL_TABLET | Freq: Three times a day (TID) | ORAL | 0 refills | Status: AC | PRN
Start: 2022-09-09 — End: 2022-10-09

## 2022-09-07 NOTE — Progress Notes (Signed)
PROVIDER NOTE: Information contained herein reflects review and annotations entered in association with encounter. Interpretation of such information and data should be left to medically-trained personnel. Information provided to patient can be located elsewhere in the medical record under "Patient Instructions". Document created using STT-dictation technology, any transcriptional errors that may result from process are unintentional.    Patient: Ruth Stout  Service Category: E/M  Provider: Edward Jolly, MD  DOB: May 12, 1945  DOS: 09/07/2022  Referring Provider: Enid Baas, MD  MRN: [id number]  Specialty: Interventional Pain Management  PCP: Enid Baas, MD  Type: Established Patient  Setting: Ambulatory outpatient    Location: Office  Delivery: Face-to-face     HPI  Ms. Ruth Stout, a 77 y.o. year old female, is here today because of her Chronic pain syndrome [G89.4]. [name] primary complain today is Foot Pain  Pain Assessment: Severity of Chronic pain, Neuropathic pain is reported as a 5 /10. Location: Foot Right, Left/Denies. Onset: More than a month ago. Quality: Constant, Tingling, Numbness, Sharp. Timing: Constant. Modifying factor(s): Walking helps and Qutenza helps some because made toes more numb. Vitals:  height is 5\' 1"  (1.549 m) and weight is 173 lb (78.5 kg). Her temporal temperature is 97.3 F (36.3 C) (abnormal). Her blood pressure is 112/68 and her pulse is 84. Her respiration is 18 and oxygen saturation is 97%.  BMI: Estimated body mass index is 32.69 kg/m as calculated from the following:   Height as of this encounter: 5\' 1"  (1.549 m).   Weight as of this encounter: 173 lb (78.5 kg). Last encounter: 07/26/2022. Last procedure: 08/10/2022.  Reason for encounter: both, medication management and post-procedure evaluation and assessment.    Post-procedure evaluation   Type: Qutenza Neurolysis #1  Laterality:  Bilateral Area treated: Feet Imaging  Guidance: None Anesthesia/analgesia/anxiolysis/sedation: None required Medication (Right): Qutenza (capsaicin 8%) topical system Medication (Left): Qutenza (capsaicin 8%) topical system Date: 08/10/2022 Performed by: Edward Jolly, MD Rationale (medical necessity): procedure needed and proper for the treatment of Ms. Esteban's medical symptoms and needs. Indication: Painful diabetic peripheral neuralgia (DPN) (ICD-10-CM:E11.40) severe enough to impact quality of life or function.  NAS-11 Pain score:   Pre-procedure: 9 /10   Post-procedure: 9 /10   Procedure #2: Right glenohumeral joint injection; 60 to 70% pain relief that is ongoing     Effectiveness:  Initial hour after procedure: 0 % (Felt a lot of buring pain)  Subsequent 4-6 hours post-procedure: 0 % (a lot of buring pain after and heat sentivity for 1 week)  Analgesia past initial 6 hours: 50 %  Ongoing improvement:  Analgesic:  50% Function: [name] reports improvement in function ROM: [name] reports improvement in ROM   Pharmacotherapy Assessment  Analgesic: Percocet 5 mg daily prn  Monitoring: [place] PMP: PDMP reviewed during this encounter.       Pharmacotherapy: No side-effects or adverse reactions reported. Compliance: No problems identified. Effectiveness: Clinically acceptable.  [name], RN  09/07/2022  1:44 PM  Sign when Signing Visit  Nursing Pain Medication Assessment:  Safety precautions to be maintained throughout the outpatient stay will include: orient to surroundings, keep bed in low position, maintain call bell within reach at all times, provide assistance with transfer out of bed and ambulation.   Medication Inspection Compliance: Pill count conducted under aseptic conditions, in front of the patient. Neither the pills nor the bottle was removed from the patient's sight at any time. Once count was completed pills were immediately returned to  the patient in their original bottle.  Medication:  Oxycodone/APAP Pill/Patch Count:  10 of 30 pills remain Pill/Patch Appearance: Markings consistent with prescribed medication Bottle Appearance: Standard pharmacy container. Clearly labeled. Filled Date: 05 / 01 / 2024 Last Medication intake:  Today        No results found for: "CBDTHCR" No results found for: "D8THCCBX" No results found for: "D9THCCBX"  UDS:  Summary  Date Value Ref Range Status  07/26/2022 Note  Final    Comment:    ==================================================================== Compliance Drug Analysis, Ur ==================================================================== Test                             Result       Flag       Units  Drug Present and Declared for Prescription Verification   Amitriptyline                  PRESENT      EXPECTED   Nortriptyline                  PRESENT      EXPECTED    Nortriptyline is an expected metabolite of amitriptyline.  Drug Present not Declared for Prescription Verification   Ibuprofen                      PRESENT      UNEXPECTED   Lidocaine                      PRESENT      UNEXPECTED ==================================================================== Test                      Result    Flag   Units      Ref Range   Creatinine              50               mg/dL      >=16 ==================================================================== Declared Medications:  The flagging and interpretation on this report are based on the  following declared medications.  Unexpected results may arise from  inaccuracies in the declared medications.   **Note: The testing scope of this panel includes these medications:   Amitriptyline (Elavil)   **Note: The testing scope of this panel does not include the  following reported medications:   Bisacodyl (Dulcolax)  Docusate  Fluticasone (Flonase)  Glimepiride (Amaryl)  Insulin (Lantus)  Levothyroxine (Synthroid)  Linaclotide (Linzess)  Losartan (Cozaar)   Semaglutide  Simvastatin (Zocor)  Ubiquinone (CoQ10) ==================================================================== For clinical consultation, please call [phone]. ====================================================================       ROS  Constitutional: Denies any fever or chills Gastrointestinal: No reported hemesis, hematochezia, vomiting, or acute GI distress Musculoskeletal:  Improvement in bilateral foot pain and right shoulder pain Neurological: No reported episodes of acute onset apraxia, aphasia, dysarthria, agnosia, amnesia, paralysis, loss of coordination, or loss of consciousness  Medication Review  Coenzyme Q10, DSS, Semaglutide(0.25 or 0.5MG /DOS), amitriptyline, bisacodyl, fluticasone, glimepiride, insulin glargine, insulin lispro, levothyroxine, linaclotide, losartan, oxyCODONE-acetaminophen, and simvastatin  History Review  Allergy: Ms. Egeland is allergic to cephalexin, clarithromycin, codeine, erythromycin base, levofloxacin in d5w, other, penicillins, and sulfa antibiotics. Drug: Ms. Eiche  reports no history of drug use. Alcohol:  reports that she does not currently use alcohol. Tobacco:  reports that she has never smoked. She has never used smokeless tobacco. Social: Ms.  Lozeau  reports that she has never smoked. She has never used smokeless tobacco. She reports that she does not currently use alcohol. She reports that she does not use drugs. Medical:  has a past medical history of Arthritis, Diabetes mellitus without complication (HCC), Diabetic neuropathy (HCC), Fibromyalgia, Hypothyroid, Insomnia, and Thyroid disease. Surgical: Ms. Marklin  has a past surgical history that includes partial hysterectomy; Appendectomy; Tonsillectomy; Wrist surgery; and tmj implants. Family: family history is not on file. She was adopted.  Laboratory Chemistry Profile   Renal Lab Results  Component Value Date   BUN 16 05/02/2020   CREATININE 0.84 05/02/2020    GFRAA >60 06/23/2019   GFRNONAA >60 05/02/2020    Hepatic Lab Results  Component Value Date   AST 18 05/02/2020   ALT 14 05/02/2020   ALBUMIN 3.3 (L) 05/02/2020   ALKPHOS 63 05/02/2020    Electrolytes Lab Results  Component Value Date   NA 132 (L) 07/12/2021   K 3.4 (L) 05/02/2020   CL 94 (L) 05/02/2020   CALCIUM 9.0 05/02/2020    Bone No results found for: "VD25OH", "VD125OH2TOT", "ZO1096EA5", "WU9811BJ4", "25OHVITD1", "25OHVITD2", "25OHVITD3", "TESTOFREE", "TESTOSTERONE"  Inflammation (CRP: Acute Phase) (ESR: Chronic Phase) No results found for: "CRP", "ESRSEDRATE", "LATICACIDVEN"       Note: Above Lab results reviewed.  Physical Exam  General appearance: Well nourished, well developed, and well hydrated. In no apparent acute distress Mental status: Alert, oriented x 3 (person, place, & time)       Respiratory: No evidence of acute respiratory distress Eyes: PERLA Vitals: BP 112/68   Pulse 84   Temp (!) 97.3 F (36.3 C) (Temporal)   Resp 18   Ht 5\' 1"  (1.549 m)   Wt 173 lb (78.5 kg)   SpO2 97%   BMI 32.69 kg/m  BMI: Estimated body mass index is 32.69 kg/m as calculated from the following:   Height as of this encounter: 5\' 1"  (1.549 m).   Weight as of this encounter: 173 lb (78.5 kg). Ideal: Ideal body weight: 47.8 kg (105 lb 6.1 oz) Adjusted ideal body weight: 60.1 kg (132 lb 6.8 oz)  Improvement in right shoulder pain and range of motion  Improvement in bilateral foot paresthesias  Assessment   Diagnosis Status  1. Chronic pain syndrome   2. Chronic painful diabetic neuropathy (HCC)   3. Localized primary osteoarthritis of shoulder regions, bilateral   4. Right rotator cuff tear arthropathy   5. Primary osteoarthritis of right shoulder    Controlled Controlled Controlled    Plan of Care    Ms. TENNIE RABOLD has a current medication list which includes the following long-term medication(s): amitriptyline, fluticasone, glimepiride, lantus  solostar, insulin lispro, levothyroxine, simvastatin, linzess, and losartan.  Pharmacotherapy (Medications Ordered): Meds ordered this encounter  Medications   oxyCODONE-acetaminophen (PERCOCET) 5-325 MG tablet    Sig: Take 1 tablet by mouth every 8 (eight) hours as needed for severe pain.    Dispense:  90 tablet    Refill:  0    Chronic Pain: STOP Act (Not applicable) Fill 1 day early if closed on refill date. Avoid benzodiazepines within 8 hours of opioids   Orders:  Orders Placed This Encounter  Procedures   NEUROLYSIS    Please order Qutenza patches from pharmacy    Standing Status:   Future    Standing Expiration Date:   12/08/2022    Order Specific Question:   Where will this procedure be performed?  Answer:   [facility]   Follow-up plan:   Return in about 2 months (around 11/15/2022) for Qutenza #2.      Qutenza, right glenohumeral joint injection 08/10/2022.  Helpful.    Recent Visits Date Type Provider Dept  08/10/22 Procedure visit Edward Jolly, MD [facility]  07/26/22 Office Visit Edward Jolly, MD [facility]  Showing recent visits within past 90 days and meeting all other requirements Today's Visits Date Type Provider Dept  09/07/22 Office Visit Edward Jolly, MD [facility]  Showing today's visits and meeting all other requirements Future Appointments No visits were found meeting these conditions. Showing future appointments within next 90 days and meeting all other requirements  I discussed the assessment and treatment plan with the patient. The patient was provided an opportunity to ask questions and all were answered. The patient agreed with the plan and demonstrated an understanding of the instructions.  Patient advised to call back or seek an in-person evaluation if the symptoms or condition worsens.  Duration of encounter: .  Total time on encounter, as per AMA guidelines included both the face-to-face  and non-face-to-face time personally spent by the physician and/or other qualified health care professional(s) on the day of the encounter (includes time in activities that require the physician or other qualified health care professional and does not include time in activities normally performed by clinical staff). Physician's time may include the following activities when performed: Preparing to see the patient (e.g., pre-charting review of records, searching for previously ordered imaging, lab work, and nerve conduction tests) Review of prior analgesic pharmacotherapies. Reviewing PMP Interpreting ordered tests (e.g., lab work, imaging, nerve conduction tests) Performing post-procedure evaluations, including interpretation of diagnostic procedures Obtaining and/or reviewing separately obtained history Performing a medically appropriate examination and/or evaluation Counseling and educating the patient/family/caregiver Ordering medications, tests, or procedures Referring and communicating with other health care professionals (when not separately reported) Documenting clinical information in the electronic or other health record Independently interpreting results (not separately reported) and communicating results to the patient/ family/caregiver Care coordination (not separately reported)  Note by: Edward Jolly, MD Date: 09/07/2022; Time: 2:21 PM

## 2022-09-07 NOTE — Progress Notes (Signed)
  Nursing Pain Medication Assessment:  Safety precautions to be maintained throughout the outpatient stay will include: orient to surroundings, keep bed in low position, maintain call bell within reach at all times, provide assistance with transfer out of bed and ambulation.   Medication Inspection Compliance: Pill count conducted under aseptic conditions, in front of the patient. Neither the pills nor the bottle was removed from the patient's sight at any time. Once count was completed pills were immediately returned to the patient in their original bottle.  Medication: Oxycodone/APAP Pill/Patch Count:  10 of 30 pills remain Pill/Patch Appearance: Markings consistent with prescribed medication Bottle Appearance: Standard pharmacy container. Clearly labeled. Filled Date: 05 / 01 / 2024 Last Medication intake:  Today

## 2022-09-29 ENCOUNTER — Other Ambulatory Visit: Payer: Medicare Other

## 2022-11-14 ENCOUNTER — Other Ambulatory Visit: Payer: Self-pay | Admitting: Internal Medicine

## 2022-11-14 DIAGNOSIS — Z1231 Encounter for screening mammogram for malignant neoplasm of breast: Secondary | ICD-10-CM

## 2022-11-16 ENCOUNTER — Ambulatory Visit
Payer: Medicare Other | Attending: Student in an Organized Health Care Education/Training Program | Admitting: Student in an Organized Health Care Education/Training Program

## 2022-11-16 ENCOUNTER — Encounter: Payer: Self-pay | Admitting: Student in an Organized Health Care Education/Training Program

## 2022-11-16 VITALS — BP 131/54 | HR 72 | Temp 97.2°F | Resp 16 | Ht 62.0 in | Wt 174.0 lb

## 2022-11-16 DIAGNOSIS — G894 Chronic pain syndrome: Secondary | ICD-10-CM | POA: Insufficient documentation

## 2022-11-16 DIAGNOSIS — M19012 Primary osteoarthritis, left shoulder: Secondary | ICD-10-CM | POA: Diagnosis present

## 2022-11-16 DIAGNOSIS — E1140 Type 2 diabetes mellitus with diabetic neuropathy, unspecified: Secondary | ICD-10-CM

## 2022-11-16 DIAGNOSIS — M19011 Primary osteoarthritis, right shoulder: Secondary | ICD-10-CM | POA: Diagnosis present

## 2022-11-16 MED ORDER — CAPSAICIN-CLEANSING GEL 8 % EX KIT
4.0000 | PACK | Freq: Once | CUTANEOUS | Status: AC
  Administered 2022-11-16: 4 via TOPICAL
  Filled 2022-11-16: qty 4

## 2022-11-16 NOTE — Progress Notes (Signed)
PROVIDER NOTE: Interpretation of information contained herein should be left to medically-trained personnel. Specific patient instructions are provided elsewhere under "Patient Instructions" section of medical record. This document was created in part using STT-dictation technology, any transcriptional errors that may result from this process are unintentional.  Patient: Ruth Stout Type: Established DOB: 1945-07-06 MRN: [id number] PCP: Enid Baas, MD  Service: Procedure DOS: 11/16/2022 Setting: Ambulatory Location: Ambulatory outpatient facility Delivery: Face-to-face Provider: Edward Jolly, MD Specialty: Interventional Pain Management Specialty designation: 09 Location: Outpatient facility Ref. Prov.: Enid Baas, MD       Interventional Therapy   Interventional Treatment:           Type: Qutenza Neurolysis #2  Laterality:  Bilateral Area treated: Feet Imaging Guidance: None Anesthesia/analgesia/anxiolysis/sedation: None required Medication (Right): Qutenza (capsaicin 8%) topical system Medication (Left): Qutenza (capsaicin 8%) topical system Date: 11/16/2022 Performed by: Edward Jolly, MD Rationale (medical necessity): procedure needed and proper for the treatment of Ruth Stout's medical symptoms and needs. Indication: Painful diabetic peripheral neuralgia (DPN) (ICD-10-CM:E11.40) severe enough to impact quality of life or function. 1. Chronic painful diabetic neuropathy (HCC)   2. Chronic pain syndrome   3. Localized osteoarthritis of shoulder regions, bilateral    NAS-11 Pain score:   Pre-procedure: 7 /10   Post-procedure: 7 /10     Position / Prep / Materials:  Position: Supine  Materials: Qutenza Kit  H&P (Pre-op Assessment):  Ruth Stout is a 77 y.o. (year old), female patient, seen today for interventional treatment. She  has a past surgical history that includes partial hysterectomy; Appendectomy; Tonsillectomy; Wrist surgery; and tmj implants. Ms.  Stout has a current medication list which includes the following prescription(s): amitriptyline, bisacodyl, coenzyme q10, dss, fluticasone, glimepiride, lantus solostar, insulin lispro, levothyroxine, linzess, losartan, simvastatin, and semaglutide(0.25 or 0.5mg /dos). Her primarily concern today is the Foot Pain (Bilat neuropathy) and Shoulder Pain (bilat)  Initial Vital Signs:  Pulse/HCG Rate: 91  Temp: (!) 96.8 F (36 C) Resp: 16 BP: (!) 145/68 SpO2: 97 %  BMI: Estimated body mass index is 31.83 kg/m as calculated from the following:   Height as of this encounter: 5\' 2"  (1.575 m).   Weight as of this encounter: 174 lb (78.9 kg).  Risk Assessment: Allergies: Reviewed. She is allergic to cephalexin, clarithromycin, codeine, erythromycin base, levofloxacin in d5w, other, penicillins, and sulfa antibiotics.  Allergy Precautions: None required Coagulopathies: Reviewed. None identified.  Blood-thinner therapy: None at this time Active Infection(s): Reviewed. None identified. Ruth Stout is afebrile  Site Confirmation: Ruth Stout was asked to confirm the procedure and laterality before marking the site Procedure checklist: Completed Consent: Before the procedure and under the influence of no sedative(s), amnesic(s), or anxiolytics, the patient was informed of the treatment options, risks and possible complications. To fulfill our ethical and legal obligations, as recommended by the American Medical Association's Code of Ethics, I have informed the patient of my clinical impression; the nature and purpose of the treatment or procedure; the risks, benefits, and possible complications of the intervention; the alternatives, including doing nothing; the risk(s) and benefit(s) of the alternative treatment(s) or procedure(s); and the risk(s) and benefit(s) of doing nothing. The patient was provided information about the general risks and possible complications associated with the procedure. These may  include, but are not limited to: failure to achieve desired goals, infection, bleeding, organ or nerve damage, allergic reactions, paralysis, and death. In addition, the patient was informed of those risks and complications associated to the procedure, such as  failure to decrease pain; infection; bleeding; organ or nerve damage with subsequent damage to sensory, motor, and/or autonomic systems, resulting in permanent pain, numbness, and/or weakness of one or several areas of the body; allergic reactions; (i.e.: anaphylactic reaction); and/or death. Furthermore, the patient was informed of those risks and complications associated with the medications. These include, but are not limited to: allergic reactions (i.e.: anaphylactic or anaphylactoid reaction(s)); adrenal axis suppression; blood sugar elevation that in diabetics may result in ketoacidosis or comma; water retention that in patients with history of congestive heart failure may result in shortness of breath, pulmonary edema, and decompensation with resultant heart failure; weight gain; swelling or edema; medication-induced neural toxicity; particulate matter embolism and blood vessel occlusion with resultant organ, and/or nervous system infarction; and/or aseptic necrosis of one or more joints. Finally, the patient was informed that Medicine is not an exact science; therefore, there is also the possibility of unforeseen or unpredictable risks and/or possible complications that may result in a catastrophic outcome. The patient indicated having understood very clearly. We have given the patient no guarantees and we have made no promises. Enough time was given to the patient to ask questions, all of which were answered to the patient's satisfaction. [name] has indicated that she wanted to continue with the procedure. Attestation: I, the ordering provider, attest that I have discussed with the patient the benefits, risks, side-effects, alternatives,  likelihood of achieving goals, and potential problems during recovery for the procedure that I have provided informed consent. Date  Time: 11/16/2022 10:51 AM  Pre-Procedure Preparation:  Monitoring: As per clinic protocol. Respiration, ETCO2, SpO2, BP, heart rate and rhythm monitor placed and checked for adequate function Safety Precautions: Patient was assessed for positional comfort and pressure points before starting the procedure. Time-out: I initiated and conducted the "Time-out" before starting the procedure, as per protocol. The patient was asked to participate by confirming the accuracy of the "Time Out" information. Verification of the correct person, site, and procedure were performed and confirmed by me, the nursing staff, and the patient. "Time-out" conducted as per Joint Commission's Universal Protocol (UP.01.01.01). Time: 1114 Start Time: 1126 hrs.  Description/Narrative of Procedure:          Region: Distal lower extremity Target Area: Sensory peripheral nerves affected by diabetic peripheral neuropathy Site: Feet Approach: Percutaneous  No./Series: Not applicable  Type: Percutaneous  Purpose: Therapeutic  Region: Distal lower extremities  Start Time: 1126 hrs.  Description of the Procedure: Protocol guidelines were followed. The patient was assisted into a comfortable position.  Informed consent was obtained in the patient monitored in the usual manner.  All questions were answered prior to the procedure.  They Qutenza patches were applied to the affected area and then covered with the wrap.  The Patient was kept under observation until the treatment was completed.  The patches were removed and the treated area was inspected.  Vitals:   11/16/22 1104 11/16/22 1109  BP: (!) 145/68 (!) 131/54  Pulse: 91 72  Resp: 16 16  Temp: (!) 96.8 F (36 C) (!) 97.2 F (36.2 C)  SpO2: 97% 100%  Weight: 174 lb (78.9 kg)   Height: 5\' 2"  (1.575 m)      End Time: 1157  hrs.    Post-operative Assessment:  Post-procedure Vital Signs:  Pulse/HCG Rate: 72  Temp: (!) 97.2 F (36.2 C) Resp: 16 BP: (!) 131/54 SpO2: 100 %  EBL: None  Complications: No immediate post-treatment complications observed by team, or  reported by patient.  Note: The patient tolerated the entire procedure well. A repeat set of vitals were taken after the procedure and the patient was kept under observation following institutional policy, for this type of procedure. Post-procedural neurological assessment was performed, showing return to baseline, prior to discharge. The patient was provided with post-procedure discharge instructions, including a section on how to identify potential problems. Should any problems arise concerning this procedure, the patient was given instructions to immediately contact us, at any time, without hesitation. In any case, we plan to contact the patient by telephone for a follow-up status report regarding this interventional procedure.  Comments:  No additional relevant information.  Plan of Care (POC)  Patient is complaining of bilateral shoulder pain related to shoulder osteoarthritis.  She had a right shoulder steroid injection done on 08/10/2022 that provided her with 3 months (70%) of pain relief now with gradual return of pain.  She states that her left shoulder is also bothering her.  We discussed a bilateral shoulder steroid injection.  Risks and benefits reviewed and patient elected proceed with.    Orders:  Orders Placed This Encounter  Procedures   SHOULDER INJECTION    Standing Status:   Future    Standing Expiration Date:   02/16/2023    Scheduling Instructions:     Procedure: Intra-articular shoulder (Glenohumeral) joint injection     Side: Bilateral     Level: Glenohumeral joint               Sedation: Patient's choice.     Timeframe: As permitted by the schedule    Order Specific Question:   Where will this procedure be performed?     Answer:   [facility]     Medications ordered for procedure: Meds ordered this encounter  Medications   capsaicin topical system 8 % patch 4 patch   Medications administered: We administered capsaicin topical system.  See the medical record for exact dosing, route, and time of administration.  Follow-up plan:   Return in about 2 weeks (around 11/30/2022) for B/L Shoulder injection, in clinic NS.       Qutenza, right glenohumeral joint injection 08/10/2022.  Helpful. Qutenza #2 11/16/22     Recent Visits Date Type Provider Dept  09/07/22 Office Visit Edward Jolly, MD [facility]  Showing recent visits within past 90 days and meeting all other requirements Today's Visits Date Type Provider Dept  11/16/22 Procedure visit Edward Jolly, MD [facility]  Showing today's visits and meeting all other requirements Future Appointments Date Type Provider Dept  11/30/22 Appointment Edward Jolly, MD [facility]  Showing future appointments within next 90 days and meeting all other requirements  Disposition: Discharge home  Discharge (Date  Time): 11/16/2022; 1159 hrs.   Primary Care Physician: Enid Baas, MD Location: [facility] Note by: Edward Jolly, MD (TTS technology used. I apologize for any typographical errors that were not detected and corrected.) Date: 11/16/2022; Time: 1:23 PM  Disclaimer:  Medicine is not an Visual merchandiser. The only guarantee in medicine is that nothing is guaranteed. It is important to note that the decision to proceed with this intervention was based on the information collected from the patient. The Data and conclusions were drawn from the patient's questionnaire, the interview, and the physical examination. Because the information was provided in large part by the patient, it cannot be guaranteed that it has not been purposely or unconsciously manipulated. Every effort  has been made to  obtain as much relevant data as possible for this evaluation. It is important to note that the conclusions that lead to this procedure are derived in large part from the available data. Always take into account that the treatment will also be dependent on availability of resources and existing treatment guidelines, considered by other Pain Management Practitioners as being common knowledge and practice, at the time of the intervention. For Medico-Legal purposes, it is also important to point out that variation in procedural techniques and pharmacological choices are the acceptable norm. The indications, contraindications, technique, and results of the above procedure should only be interpreted and judged by a Board-Certified Interventional Pain Specialist with extensive familiarity and expertise in the same exact procedure and technique.

## 2022-11-16 NOTE — Progress Notes (Signed)
Safety precautions to be maintained throughout the outpatient stay will include: orient to surroundings, keep bed in low position, maintain call bell within reach at all times, provide assistance with transfer out of bed and ambulation.  

## 2022-11-16 NOTE — Progress Notes (Signed)
Nursing Pain Medication Assessment:  Safety precautions to be maintained throughout the outpatient stay will include: orient to surroundings, keep bed in low position, maintain call bell within reach at all times, provide assistance with transfer out of bed and ambulation.  Medication Inspection Compliance: Pill count conducted under aseptic conditions, in front of the patient. Neither the pills nor the bottle was removed from the patient's sight at any time. Once count was completed pills were immediately returned to the patient in their original bottle.  Medication: Oxycodone/APAP Pill/Patch Count:  28 of 90 pills remain Pill/Patch Appearance: Markings consistent with prescribed medication Bottle Appearance: Standard pharmacy container. Clearly labeled. Filled Date: 5 / 69 / 2024 Last Medication intake:  Today

## 2022-11-17 ENCOUNTER — Telehealth: Payer: Self-pay

## 2022-11-17 NOTE — Telephone Encounter (Signed)
Post procedure follow up.  Patient states she is doing much better.

## 2022-11-30 ENCOUNTER — Ambulatory Visit
Admission: RE | Admit: 2022-11-30 | Discharge: 2022-11-30 | Disposition: A | Discharge status: Home / Self Care | Payer: Medicare Other | Source: Ambulatory Visit | Attending: Student in an Organized Health Care Education/Training Program | Admitting: Student in an Organized Health Care Education/Training Program

## 2022-11-30 ENCOUNTER — Ambulatory Visit
Payer: Medicare Other | Attending: Student in an Organized Health Care Education/Training Program | Admitting: Student in an Organized Health Care Education/Training Program

## 2022-11-30 ENCOUNTER — Encounter: Payer: Self-pay | Admitting: Student in an Organized Health Care Education/Training Program

## 2022-11-30 VITALS — BP 109/64 | HR 82 | Temp 97.7°F | Resp 16 | Ht 62.0 in | Wt 173.0 lb

## 2022-11-30 DIAGNOSIS — M19012 Primary osteoarthritis, left shoulder: Secondary | ICD-10-CM | POA: Diagnosis not present

## 2022-11-30 DIAGNOSIS — M19011 Primary osteoarthritis, right shoulder: Secondary | ICD-10-CM | POA: Insufficient documentation

## 2022-11-30 DIAGNOSIS — E1140 Type 2 diabetes mellitus with diabetic neuropathy, unspecified: Secondary | ICD-10-CM | POA: Diagnosis present

## 2022-11-30 MED ORDER — OXYCODONE-ACETAMINOPHEN 5-325 MG PO TABS: 1.0000 | ORAL_TABLET | Freq: Four times a day (QID) | ORAL | 0 refills | Status: AC | PRN

## 2022-11-30 MED ORDER — ROPIVACAINE HCL 2 MG/ML IJ SOLN
9.0000 mL | Freq: Once | INTRAMUSCULAR | Status: AC
  Administered 2022-11-30: 9 mL
  Filled 2022-11-30: qty 20

## 2022-11-30 MED ORDER — IOHEXOL 180 MG/ML  SOLN
10.0000 mL | Freq: Once | INTRAMUSCULAR | Status: AC
  Administered 2022-11-30: 10 mL via INTRA_ARTICULAR
  Filled 2022-11-30: qty 20

## 2022-11-30 MED ORDER — METHYLPREDNISOLONE ACETATE 80 MG/ML IJ SUSP
80.0000 mg | Freq: Once | INTRAMUSCULAR | Status: AC
  Administered 2022-11-30: 80 mg
  Filled 2022-11-30: qty 1

## 2022-11-30 MED ORDER — LIDOCAINE HCL 2 % IJ SOLN
20.0000 mL | Freq: Once | INTRAMUSCULAR | Status: AC
  Administered 2022-11-30: 400 mg
  Filled 2022-11-30: qty 40

## 2022-11-30 NOTE — Progress Notes (Signed)
PROVIDER NOTE: Interpretation of information contained herein should be left to medically-trained personnel. Specific patient instructions are provided elsewhere under "Patient Instructions" section of medical record. This document was created in part using STT-dictation technology, any transcriptional errors that may result from this process are unintentional.  Patient: Ruth Stout Type: Established DOB: [birth year] MRN: [id number] PCP: Enid Baas, MD  Service: Procedure DOS: 11/30/2022 Setting: Ambulatory Location: Ambulatory outpatient facility Delivery: Face-to-face Provider: Edward Jolly, MD Specialty: Interventional Pain Management Specialty designation: 09 Location: Outpatient facility Ref. Prov.: Enid Baas, MD       Interventional Therapy   Procedure: Glenohumeral Joint (shoulder) Injection #1  Laterality: Bilateral (-50)  Level: Shoulder   Imaging: Fluoroscopy-guided         Anesthesia: Local anesthesia (1-2% Lidocaine) DOS: 11/30/2022  Performed by: Edward Jolly, MD  Purpose: Diagnostic/Therapeutic Indications: Shoulder pain severe enough to impact quality of life or function. Rationale (medical necessity): procedure needed and proper for the diagnosis and/or treatment of Ruth Stout's medical symptoms and needs. 1. Localized osteoarthritis of shoulder regions, bilateral   2. Primary osteoarthritis of right shoulder   3. Localized primary osteoarthritis of shoulder regions, bilateral   4. Chronic painful diabetic neuropathy (HCC)    NAS-11 Pain score:   Pre-procedure: 5 /10   Post-procedure: 6 /10      Target: Glenohumeral Joint (shoulder) Location: Intra-articular  Region: Entire Shoulder Area Approach: Anterolateral approach. Type of procedure: Percutaneous joint injection   Position  Prep  Materials:  Position: Supine Prep solution: DuraPrep (Iodine Povacrylex [0.7% available iodine] and Isopropyl Alcohol, 74% w/w) Prep Area: Entire  shoulder Area Materials:  Tray: Block Needle(s):  Type: Spinal  Gauge (G): 22  Length: 3.5-in  Qty: 1  H&P (Pre-op Assessment):  Ruth Stout is a 77 y.o. (year old), female patient, seen today for interventional treatment. She  has a past surgical history that includes partial hysterectomy; Appendectomy; Tonsillectomy; Wrist surgery; and tmj implants. Ruth Stout has a current medication list which includes the following prescription(s): amitriptyline, bisacodyl, coenzyme q10, dss, fluticasone, glimepiride, lantus solostar, insulin lispro, levothyroxine, linzess, [START ON 12/04/2022] oxycodone-acetaminophen, semaglutide(0.25 or 0.5mg /dos), simvastatin, and losartan. Her primarily concern today is the Shoulder Pain (R>L)  Initial Vital Signs:  Pulse/HCG Rate: 82ECG Stout Rate: 74 Temp: 97.7 F (36.5 C) Resp: 18 BP: 111/68 SpO2: 97 %  BMI: Estimated body mass index is 31.64 kg/m as calculated from the following:   Height as of this encounter: 5\' 2"  (1.575 m).   Weight as of this encounter: 173 lb (78.5 kg).  Risk Assessment: Allergies: Reviewed. She is allergic to cephalexin, clarithromycin, codeine, erythromycin base, levofloxacin in d5w, other, penicillins, and sulfa antibiotics.  Allergy Precautions: None required Coagulopathies: Reviewed. None identified.  Blood-thinner therapy: None at this time Active Infection(s): Reviewed. None identified. Ruth Stout is afebrile  Site Confirmation: Ruth Stout was asked to confirm the procedure and laterality before marking the site Procedure checklist: Completed Consent: Before the procedure and under the influence of no sedative(s), amnesic(s), or anxiolytics, the patient was informed of the treatment options, risks and possible complications. To fulfill our ethical and legal obligations, as recommended by the American Medical Association's Code of Ethics, I have informed the patient of my clinical impression; the nature and purpose of the  treatment or procedure; the risks, benefits, and possible complications of the intervention; the alternatives, including doing nothing; the risk(s) and benefit(s) of the alternative treatment(s) or procedure(s); and the risk(s) and benefit(s) of doing nothing. The patient  was provided information about the general risks and possible complications associated with the procedure. These may include, but are not limited to: failure to achieve desired goals, infection, bleeding, organ or nerve damage, allergic reactions, paralysis, and death. In addition, the patient was informed of those risks and complications associated to the procedure, such as failure to decrease pain; infection; bleeding; organ or nerve damage with subsequent damage to sensory, motor, and/or autonomic systems, resulting in permanent pain, numbness, and/or weakness of one or several areas of the body; allergic reactions; (i.e.: anaphylactic reaction); and/or death. Furthermore, the patient was informed of those risks and complications associated with the medications. These include, but are not limited to: allergic reactions (i.e.: anaphylactic or anaphylactoid reaction(s)); adrenal axis suppression; blood sugar elevation that in diabetics may result in ketoacidosis or comma; water retention that in patients with history of congestive Stout failure may result in shortness of breath, pulmonary edema, and decompensation with resultant Stout failure; weight gain; swelling or edema; medication-induced neural toxicity; particulate matter embolism and blood vessel occlusion with resultant organ, and/or nervous system infarction; and/or aseptic necrosis of one or more joints. Finally, the patient was informed that Medicine is not an exact science; therefore, there is also the possibility of unforeseen or unpredictable risks and/or possible complications that may result in a catastrophic outcome. The patient indicated having understood very clearly. We  have given the patient no guarantees and we have made no promises. Enough time was given to the patient to ask questions, all of which were answered to the patient's satisfaction. Ruth Stout has indicated that she wanted to continue with the procedure. Attestation: I, the ordering provider, attest that I have discussed with the patient the benefits, risks, side-effects, alternatives, likelihood of achieving goals, and potential problems during recovery for the procedure that I have provided informed consent. Date  Time: 11/30/2022 10:55 AM   Imaging Guidance (Non-Spinal):          Type of Imaging Technique: Fluoroscopy Guidance (Non-Spinal) Indication(s): Assistance in needle guidance and placement for procedures requiring needle placement in or near specific anatomical locations not easily accessible without such assistance. Exposure Time: Please see nurses notes. Contrast: None used. Fluoroscopic Guidance: I was personally present during the use of fluoroscopy. "Tunnel Vision Technique" used to obtain the best possible view of the target area. Parallax error corrected before commencing the procedure. "Direction-depth-direction" technique used to introduce the needle under continuous pulsed fluoroscopy. Once target was reached, antero-posterior, oblique, and lateral fluoroscopic projection used confirm needle placement in all planes. Images permanently stored in EMR. Interpretation: No contrast injected. I personally interpreted the imaging intraoperatively. Adequate needle placement confirmed in multiple planes. Permanent images saved into the patient's record.  Pre-Procedure Preparation:  Monitoring: As per clinic protocol. Respiration, ETCO2, SpO2, BP, Stout rate and rhythm monitor placed and checked for adequate function Safety Precautions: Patient was assessed for positional comfort and pressure points before starting the procedure. Time-out: I initiated and conducted the "Time-out" before  starting the procedure, as per protocol. The patient was asked to participate by confirming the accuracy of the "Time Out" information. Verification of the correct person, site, and procedure were performed and confirmed by me, the nursing staff, and the patient. "Time-out" conducted as per Joint Commission's Universal Protocol (UP.01.01.01). Time: 1155 Start Time: 1155 hrs.  Description  Narrative of Procedure:          Rationale (medical necessity): procedure needed and proper for the diagnosis and/or treatment of the patient's medical symptoms  and needs. Procedural Technique Safety Precautions: Aspiration looking for blood return was conducted prior to all injections. At no point did we inject any substances, as a needle was being advanced. No attempts were made at seeking any paresthesias. Safe injection practices and needle disposal techniques used. Medications properly checked for expiration dates. SDV (single dose vial) medications used. Description of the Procedure: Protocol guidelines were followed. The patient was placed in position over the procedure table. The target area was identified and the area prepped in the usual manner. Skin & deeper tissues infiltrated with local anesthetic. Appropriate amount of time allowed to pass for local anesthetics to take effect. The procedure needles were then advanced to the target area. Proper needle placement secured. Negative aspiration confirmed. Solution injected in intermittent fashion, asking for systemic symptoms every 0.5cc of injectate. The needles were then removed and the area cleansed, making sure to leave some of the prepping solution back to take advantage of its long term bactericidal properties.   10 cc solution made of 9 cc of 0.2% ropivacaine, 1 cc of methylprednisolone, 80 mg/cc.  5 cc injected into the left shoulder joint, 5 cc injected into the right shoulder joint.            Vitals:   11/30/22 1101 11/30/22 1104 11/30/22 1156  11/30/22 1201  BP:  111/68 115/72 109/64  Pulse:  82    Resp:  18 16 16   Temp:  97.7 F (36.5 C)    TempSrc:  Temporal    SpO2:  97% 98% 97%  Weight: 173 lb (78.5 kg) 173 lb (78.5 kg)    Height: 5' 1.5" (1.562 m) 5\' 2"  (1.575 m)       Start Time: 1155 hrs. End Time: 1200 hrs.   Post-operative Assessment:  Post-procedure Vital Signs:  Pulse/HCG Rate: 8274 Temp: 97.7 F (36.5 C) Resp: 16 BP: 109/64 SpO2: 97 %  EBL: None  Complications: No immediate post-treatment complications observed by team, or reported by patient.  Note: The patient tolerated the entire procedure well. A repeat set of vitals were taken after the procedure and the patient was kept under observation following institutional policy, for this type of procedure. Post-procedural neurological assessment was performed, showing return to baseline, prior to discharge. The patient was provided with post-procedure discharge instructions, including a section on how to identify potential problems. Should any problems arise concerning this procedure, the patient was given instructions to immediately contact us, at any time, without hesitation. In any case, we plan to contact the patient by telephone for a follow-up status report regarding this interventional procedure.  Comments:  No additional relevant information.  Plan of Care (POC)  Orders:  Orders Placed This Encounter  Procedures   DG PAIN CLINIC C-ARM 1-60 MIN NO REPORT    Intraoperative interpretation by procedural physician at [facility].    Standing Status:   Standing    Number of Occurrences:   1    Order Specific Question:   Reason for exam:    Answer:   Assistance in needle guidance and placement for procedures requiring needle placement in or near specific anatomical locations not easily accessible without such assistance.     Medications ordered for procedure: Meds ordered this encounter  Medications   iohexol (OMNIPAQUE) 180 MG/ML  injection 10 mL    Must be Myelogram-compatible. If not available, you may substitute with a water-soluble, non-ionic, hypoallergenic, myelogram-compatible radiological contrast medium.   lidocaine (XYLOCAINE) 2 % (with pres) injection  400 mg   methylPREDNISolone acetate (DEPO-MEDROL) injection 80 mg   ropivacaine (PF) 2 mg/mL (0.2%) (NAROPIN) injection 9 mL   oxyCODONE-acetaminophen (PERCOCET) 5-325 MG tablet    Sig: Take 1 tablet by mouth every 6 (six) hours as needed for severe pain. Must last 30 days.    Dispense:  105 tablet    Refill:  0    Chronic Pain: STOP Act (Not applicable) Fill 1 day early if closed on refill date. Avoid benzodiazepines within 8 hours of opioids   Medications administered: We administered iohexol, lidocaine, methylPREDNISolone acetate, and ropivacaine (PF) 2 mg/mL (0.2%).  See the medical record for exact dosing, route, and time of administration.  Follow-up plan:   Return in about 8 weeks (around 01/25/2023) for Medication Management, Post Procedure Evaluation, in person.       Qutenza, right glenohumeral joint injection 08/10/2022.  Helpful. Qutenza #2 11/16/22. B/L Glenohumeral joint 11/30/22      Recent Visits Date Type Provider Dept  11/16/22 Procedure visit Edward Jolly, MD [facility]  09/07/22 Office Visit Edward Jolly, MD [facility]  Showing recent visits within past 90 days and meeting all other requirements Today's Visits Date Type Provider Dept  11/30/22 Procedure visit Edward Jolly, MD [facility]  Showing today's visits and meeting all other requirements Future Appointments Date Type Provider Dept  01/25/23 Appointment Edward Jolly, MD [facility]  Showing future appointments within next 90 days and meeting all other requirements  Disposition: Discharge home  Discharge (Date  Time): 11/30/2022; 1210 hrs.   Primary Care Physician: Enid Baas, MD Location: [facility] Note by: Edward Jolly, MD (TTS technology used. I apologize for any typographical errors that were not detected and corrected.) Date: 11/30/2022; Time: 1:54 PM  Disclaimer:  Medicine is not an Visual merchandiser. The only guarantee in medicine is that nothing is guaranteed. It is important to note that the decision to proceed with this intervention was based on the information collected from the patient. The Data and conclusions were drawn from the patient's questionnaire, the interview, and the physical examination. Because the information was provided in large part by the patient, it cannot be guaranteed that it has not been purposely or unconsciously manipulated. Every effort has been made to obtain as much relevant data as possible for this evaluation. It is important to note that the conclusions that lead to this procedure are derived in large part from the available data. Always take into account that the treatment will also be dependent on availability of resources and existing treatment guidelines, considered by other Pain Management Practitioners as being common knowledge and practice, at the time of the intervention. For Medico-Legal purposes, it is also important to point out that variation in procedural techniques and pharmacological choices are the acceptable norm. The indications, contraindications, technique, and results of the above procedure should only be interpreted and judged by a Board-Certified Interventional Pain Specialist with extensive familiarity and expertise in the same exact procedure and technique.

## 2022-11-30 NOTE — Progress Notes (Signed)
Safety precautions to be maintained throughout the outpatient stay will include: orient to surroundings, keep bed in low position, maintain call bell within reach at all times, provide assistance with transfer out of bed and ambulation.  Nursing Pain Medication Assessment:  Safety precautions to be maintained throughout the outpatient stay will include: orient to surroundings, keep bed in low position, maintain call bell within reach at all times, provide assistance with transfer out of bed and ambulation.  Medication Inspection Compliance: Pill count conducted under aseptic conditions, in front of the patient. Neither the pills nor the bottle was removed from the patient's sight at any time. Once count was completed pills were immediately returned to the patient in their original bottle.  Medication: Oxycodone/APAP Pill/Patch Count:  8 of 90 pills remain Pill/Patch Appearance: Markings consistent with prescribed medication Bottle Appearance: Standard pharmacy container. Clearly labeled. Filled Date: 05 / 31 / 2024 Last Medication intake:  Today

## 2022-11-30 NOTE — Patient Instructions (Signed)

## 2022-12-01 ENCOUNTER — Telehealth: Payer: Self-pay

## 2022-12-01 NOTE — Telephone Encounter (Signed)
Called PP. Denies any needs at this time. Able to sleep last pm. Instructed to call if needed.

## 2022-12-05 ENCOUNTER — Encounter: Payer: Self-pay | Admitting: Student in an Organized Health Care Education/Training Program

## 2023-01-25 ENCOUNTER — Ambulatory Visit: Payer: Medicare Other | Admitting: Student in an Organized Health Care Education/Training Program

## 2023-01-26 ENCOUNTER — Ambulatory Visit
Payer: Medicare Other | Attending: Student in an Organized Health Care Education/Training Program | Admitting: Student in an Organized Health Care Education/Training Program

## 2023-01-26 ENCOUNTER — Encounter: Payer: Self-pay | Admitting: Student in an Organized Health Care Education/Training Program

## 2023-01-26 VITALS — BP 118/76 | HR 90 | Temp 97.8°F | Resp 16 | Ht 61.5 in | Wt 167.0 lb

## 2023-01-26 DIAGNOSIS — M19012 Primary osteoarthritis, left shoulder: Secondary | ICD-10-CM | POA: Insufficient documentation

## 2023-01-26 DIAGNOSIS — G894 Chronic pain syndrome: Secondary | ICD-10-CM | POA: Insufficient documentation

## 2023-01-26 DIAGNOSIS — M19011 Primary osteoarthritis, right shoulder: Secondary | ICD-10-CM | POA: Insufficient documentation

## 2023-01-26 MED ORDER — OXYCODONE-ACETAMINOPHEN 5-325 MG PO TABS
1.0000 | ORAL_TABLET | Freq: Four times a day (QID) | ORAL | 0 refills | Status: AC | PRN
Start: 2023-03-08 — End: 2023-04-07

## 2023-01-26 NOTE — Progress Notes (Signed)
Nursing Pain Medication Assessment:  Safety precautions to be maintained throughout the outpatient stay will include: orient to surroundings, keep bed in low position, maintain call bell within reach at all times, provide assistance with transfer out of bed and ambulation.  Medication Inspection Compliance: Pill count conducted under aseptic conditions, in front of the patient. Neither the pills nor the bottle was removed from the patient's sight at any time. Once count was completed pills were immediately returned to the patient in their original bottle.  Medication: Oxycodone/APAP Pill/Patch Count:  66 of 105 pills remain Pill/Patch Appearance: Markings consistent with prescribed medication Bottle Appearance: Standard pharmacy container. Clearly labeled. Filled Date: 8 / 27 / 2024 Last Medication intake:  Today

## 2023-01-26 NOTE — Progress Notes (Signed)
PROVIDER NOTE: Information contained herein reflects review and annotations entered in association with encounter. Interpretation of such information and data should be left to medically-trained personnel. Information provided to patient can be located elsewhere in the medical record under "Patient Instructions". Document created using STT-dictation technology, any transcriptional errors that may result from process are unintentional.    Patient: Ruth Stout  Service Category: E/M  Provider: Edward Jolly, MD  DOB: Mar 12, 1946  DOS: 01/26/2023  Referring Provider: Enid Baas, MD  MRN: [id number]  Specialty: Interventional Pain Management  PCP: Enid Baas, MD  Type: Established Patient  Setting: Ambulatory outpatient    Location: Office  Delivery: Face-to-face     HPI  Ruth Stout, a 77 y.o. year old female, is here today because of her Localized osteoarthritis of shoulder regions, bilateral [M19.011, M19.012]. Ruth Stout primary complain today is Shoulder Pain (bilat) and Foot Pain (bilat)  Pertinent problems: Ruth Stout does not have any pertinent problems on file. Pain Assessment: Severity of Chronic pain is reported as a 6 /10. Location: Shoulder Right, Left/to biceps bilat. Onset: More than a month ago. Quality: Sharp. Timing: Intermittent (different movements exacerbate pain). Modifying factor(s): meds, injection. Vitals:  height is 5' 1.5" (1.562 m) and weight is 167 lb (75.8 kg). Her temperature is 97.8 F (36.6 C). Her blood pressure is 118/76 and her pulse is 90. Her respiration is 16 and oxygen saturation is 99%.  BMI: Estimated body mass index is 31.04 kg/m as calculated from the following:   Height as of this encounter: 5' 1.5" (1.562 m).   Weight as of this encounter: 167 lb (75.8 kg). Last encounter: 09/07/2022. Last procedure: 11/30/2022.  Reason for encounter: both, medication management and post-procedure evaluation and assessment.   Post-procedure  evaluation   Procedure: Glenohumeral Joint (shoulder) Injection #1  Laterality: Bilateral (-50)  Level: Shoulder   Imaging: Fluoroscopy-guided         Anesthesia: Local anesthesia (1-2% Lidocaine) DOS: 11/30/2022  Performed by: Edward Jolly, MD  Purpose: Diagnostic/Therapeutic Indications: Shoulder pain severe enough to impact quality of life or function. Rationale (medical necessity): procedure needed and proper for the diagnosis and/or treatment of Ruth Stout's medical symptoms and needs. 1. Localized osteoarthritis of shoulder regions, bilateral   2. Primary osteoarthritis of right shoulder   3. Localized primary osteoarthritis of shoulder regions, bilateral   4. Chronic painful diabetic neuropathy (HCC)    NAS-11 Pain score:   Pre-procedure: 5 /10   Post-procedure: 6 /10       Effectiveness:  Initial hour after procedure: 50 %  Subsequent 4-6 hours post-procedure: 50 %  Analgesia past initial 6 hours: 80 % (can now sleep on right side)  Ongoing improvement:  Analgesic:  80% Function: Ruth Stout reports improvement in function ROM: Ruth Stout reports improvement in ROM   Pharmacotherapy Assessment  Analgesic: Percocet 5 mg daily as needed Monitoring: [place] PMP: PDMP reviewed during this encounter.       Pharmacotherapy: No side-effects or adverse reactions reported. Compliance: No problems identified. Effectiveness: Clinically acceptable.  Nonah Mattes, RN  01/26/2023  9:18 AM  Sign when Signing Visit Nursing Pain Medication Assessment:  Safety precautions to be maintained throughout the outpatient stay will include: orient to surroundings, keep bed in low position, maintain call bell within reach at all times, provide assistance with transfer out of bed and ambulation.  Medication Inspection Compliance: Pill count conducted under aseptic conditions, in front of the patient. Neither the pills nor the bottle  was removed from the patient's sight at any time. Once count  was completed pills were immediately returned to the patient in their original bottle.  Medication: Oxycodone/APAP Pill/Patch Count:  66 of 105 pills remain Pill/Patch Appearance: Markings consistent with prescribed medication Bottle Appearance: Standard pharmacy container. Clearly labeled. Filled Date: 8 / 27 / 2024 Last Medication intake:  Today    No results found for: "CBDTHCR" No results found for: "D8THCCBX" No results found for: "D9THCCBX"  UDS:  Summary  Date Value Ref Range Status  07/26/2022 Note  Final    Comment:    ==================================================================== Compliance Drug Analysis, Ur ==================================================================== Test                             Result       Flag       Units  Drug Present and Declared for Prescription Verification   Amitriptyline                  PRESENT      EXPECTED   Nortriptyline                  PRESENT      EXPECTED    Nortriptyline is an expected metabolite of amitriptyline.  Drug Present not Declared for Prescription Verification   Ibuprofen                      PRESENT      UNEXPECTED   Lidocaine                      PRESENT      UNEXPECTED ==================================================================== Test                      Result    Flag   Units      Ref Range   Creatinine              50               mg/dL      >=16 ==================================================================== Declared Medications:  The flagging and interpretation on this report are based on the  following declared medications.  Unexpected results may arise from  inaccuracies in the declared medications.   **Note: The testing scope of this panel includes these medications:   Amitriptyline (Elavil)   **Note: The testing scope of this panel does not include the  following reported medications:   Bisacodyl (Dulcolax)  Docusate  Fluticasone (Flonase)  Glimepiride (Amaryl)  Insulin  (Lantus)  Levothyroxine (Synthroid)  Linaclotide (Linzess)  Losartan (Cozaar)  Semaglutide  Simvastatin (Zocor)  Ubiquinone (CoQ10) ==================================================================== For clinical consultation, please call [phone]. ====================================================================       ROS  Constitutional: Denies any fever or chills Gastrointestinal: No reported hemesis, hematochezia, vomiting, or acute GI distress Musculoskeletal:  Bilateral shoulder pain Neurological: No reported episodes of acute onset apraxia, aphasia, dysarthria, agnosia, amnesia, paralysis, loss of coordination, or loss of consciousness  Medication Review  Coenzyme Q10, DSS, amitriptyline, bisacodyl, fluticasone, glimepiride, insulin glargine, insulin lispro, levothyroxine, linaclotide, losartan, oxyCODONE-acetaminophen, and simvastatin  History Review  Allergy: Ruth Stout is allergic to cephalexin, clarithromycin, codeine, erythromycin base, levofloxacin in d5w, other, penicillins, and sulfa antibiotics. Drug: Ruth Stout  reports no history of drug use. Alcohol:  reports that she does not currently use alcohol. Tobacco:  reports that she has never smoked. She has never  used smokeless tobacco. Social: Ruth Stout  reports that she has never smoked. She has never used smokeless tobacco. She reports that she does not currently use alcohol. She reports that she does not use drugs. Medical:  has a past medical history of Arthritis, Diabetes mellitus without complication (HCC), Diabetic neuropathy (HCC), Fibromyalgia, Hypothyroid, Insomnia, and Thyroid disease. Surgical: Ruth Stout  has a past surgical history that includes partial hysterectomy; Appendectomy; Tonsillectomy; Wrist surgery; and tmj implants. Family: family history is not on file. She was adopted.  Laboratory Chemistry Profile   Renal Lab Results  Component Value Date   BUN 16 05/02/2020   CREATININE  0.84 05/02/2020   GFRAA >60 06/23/2019   GFRNONAA >60 05/02/2020    Hepatic Lab Results  Component Value Date   AST 18 05/02/2020   ALT 14 05/02/2020   ALBUMIN 3.3 (L) 05/02/2020   ALKPHOS 63 05/02/2020    Electrolytes Lab Results  Component Value Date   NA 132 (L) 07/12/2021   K 3.4 (L) 05/02/2020   CL 94 (L) 05/02/2020   CALCIUM 9.0 05/02/2020    Bone No results found for: "VD25OH", "VD125OH2TOT", "ZO1096EA5", "WU9811BJ4", "25OHVITD1", "25OHVITD2", "25OHVITD3", "TESTOFREE", "TESTOSTERONE"  Inflammation (CRP: Acute Phase) (ESR: Chronic Phase) No results found for: "CRP", "ESRSEDRATE", "LATICACIDVEN"       Note: Above Lab results reviewed.  Recent Imaging Review  DG PAIN CLINIC C-ARM 1-60 MIN NO REPORT Fluoro was used, but no Radiologist interpretation will be provided.  Please refer to "NOTES" tab for provider progress note. Note: Reviewed        Physical Exam  General appearance: Well nourished, well developed, and well hydrated. In no apparent acute distress Mental status: Alert, oriented x 3 (person, place, & time)       Respiratory: No evidence of acute respiratory distress Eyes: PERLA Vitals: BP 118/76 (Patient Position: Standing)   Pulse 90   Temp 97.8 F (36.6 C)   Resp 16   Ht 5' 1.5" (1.562 m)   Wt 167 lb (75.8 kg)   SpO2 99%   BMI 31.04 kg/m  BMI: Estimated body mass index is 31.04 kg/m as calculated from the following:   Height as of this encounter: 5' 1.5" (1.562 m).   Weight as of this encounter: 167 lb (75.8 kg). Ideal: Ideal body weight: 48.9 kg (107 lb 14.6 oz) Adjusted ideal body weight: 59.7 kg (131 lb 8.8 oz)  Bilateral shoulder pain, significantly improved after shoulder joint injections.  Improved range of motion including overhead and behind the back reach  Assessment   Diagnosis Status  1. Localized osteoarthritis of shoulder regions, bilateral   2. Primary osteoarthritis of right shoulder   3. Localized primary osteoarthritis of  shoulder regions, bilateral   4. Chronic pain syndrome    Controlled Controlled Controlled    Plan of Care  Problem-specific:  No problem-specific Assessment & Plan notes found for this encounter.  Ruth Stout has a current medication list which includes the following long-term medication(s): amitriptyline, fluticasone, glimepiride, lantus solostar, insulin lispro, levothyroxine, linzess, losartan, and simvastatin.  Pharmacotherapy (Medications Ordered): Meds ordered this encounter  Medications   oxyCODONE-acetaminophen (PERCOCET/ROXICET) 5-325 MG tablet    Sig: Take 1 tablet by mouth every 6 (six) hours as needed for severe pain (pain score 7-10).    Dispense:  105 tablet    Refill:  0   Orders:  Orders Placed This Encounter  Procedures   SHOULDER INJECTION    Standing Status:  Standing    Number of Occurrences:   2    Standing Expiration Date:   07/27/2023    Scheduling Instructions:     Procedure: Intra-articular shoulder (Glenohumeral) joint injection     Side: Bilateral     Level: Glenohumeral joint               Sedation: Patient's choice.     Timeframe: As permitted by the schedule    Order Specific Question:   Where will this procedure be performed?    Answer:   [facility]   Follow-up plan:   Return for patient will call to schedule F2F appt prn.      Qutenza, right glenohumeral joint injection 08/10/2022.  Helpful. Qutenza #2 11/16/22. B/L Glenohumeral joint 11/30/22       Recent Visits Date Type Provider Dept  11/30/22 Procedure visit Edward Jolly, MD [facility]  11/16/22 Procedure visit Edward Jolly, MD [facility]  Showing recent visits within past 90 days and meeting all other requirements Today's Visits Date Type Provider Dept  01/26/23 Office Visit Edward Jolly, MD [facility]  Showing today's visits and meeting all other requirements Future Appointments No visits were found meeting these  conditions. Showing future appointments within next 90 days and meeting all other requirements  I discussed the assessment and treatment plan with the patient. The patient was provided an opportunity to ask questions and all were answered. The patient agreed with the plan and demonstrated an understanding of the instructions.  Patient advised to call back or seek an in-person evaluation if the symptoms or condition worsens.  Duration of encounter: .  Total time on encounter, as per AMA guidelines included both the face-to-face and non-face-to-face time personally spent by the physician and/or other qualified health care professional(s) on the day of the encounter (includes time in activities that require the physician or other qualified health care professional and does not include time in activities normally performed by clinical staff). Physician's time may include the following activities when performed: Preparing to see the patient (e.g., pre-charting review of records, searching for previously ordered imaging, lab work, and nerve conduction tests) Review of prior analgesic pharmacotherapies. Reviewing PMP Interpreting ordered tests (e.g., lab work, imaging, nerve conduction tests) Performing post-procedure evaluations, including interpretation of diagnostic procedures Obtaining and/or reviewing separately obtained history Performing a medically appropriate examination and/or evaluation Counseling and educating the patient/family/caregiver Ordering medications, tests, or procedures Referring and communicating with other health care professionals (when not separately reported) Documenting clinical information in the electronic or other health record Independently interpreting results (not separately reported) and communicating results to the patient/ family/caregiver Care coordination (not separately reported)  Note by: Edward Jolly, MD Date: 01/26/2023; Time: 11:07 AM

## 2023-01-26 NOTE — Patient Instructions (Signed)
  ______________________________________________________________________    Procedure instructions  Stop blood-thinners  Do not eat or drink fluids (other than water) for 6 hours before your procedure  No water for 2 hours before your procedure  Take your blood pressure medicine with a sip of water  Arrive 30 minutes before your appointment  If sedation is planned, bring suitable driver. Pennie Banter, Benedetto Goad, & public transportation are NOT APPROVED)  Carefully read the "Preparing for your procedure" detailed instructions  If you have questions call us at [phone]  ______________________________________________________________________

## 2023-04-26 ENCOUNTER — Other Ambulatory Visit: Payer: Self-pay

## 2023-04-26 DIAGNOSIS — M19011 Primary osteoarthritis, right shoulder: Secondary | ICD-10-CM

## 2023-04-26 DIAGNOSIS — G894 Chronic pain syndrome: Secondary | ICD-10-CM

## 2023-05-10 ENCOUNTER — Ambulatory Visit
Payer: Medicare Other | Attending: Student in an Organized Health Care Education/Training Program | Admitting: Student in an Organized Health Care Education/Training Program

## 2023-05-10 ENCOUNTER — Ambulatory Visit
Admission: RE | Admit: 2023-05-10 | Discharge: 2023-05-10 | Disposition: A | Discharge status: Home / Self Care | Payer: Medicare Other | Source: Ambulatory Visit | Attending: Student in an Organized Health Care Education/Training Program | Admitting: Student in an Organized Health Care Education/Training Program

## 2023-05-10 VITALS — BP 134/94 | HR 87 | Temp 97.2°F | Resp 17 | Ht 61.0 in | Wt 173.0 lb

## 2023-05-10 DIAGNOSIS — M47816 Spondylosis without myelopathy or radiculopathy, lumbar region: Secondary | ICD-10-CM | POA: Diagnosis present

## 2023-05-10 DIAGNOSIS — M19012 Primary osteoarthritis, left shoulder: Secondary | ICD-10-CM | POA: Diagnosis present

## 2023-05-10 DIAGNOSIS — G894 Chronic pain syndrome: Secondary | ICD-10-CM | POA: Diagnosis not present

## 2023-05-10 DIAGNOSIS — M19011 Primary osteoarthritis, right shoulder: Secondary | ICD-10-CM | POA: Insufficient documentation

## 2023-05-10 MED ORDER — METHYLPREDNISOLONE ACETATE 40 MG/ML IJ SUSP
40.0000 mg | Freq: Once | INTRAMUSCULAR | Status: AC
  Administered 2023-05-10: 40 mg via INTRA_ARTICULAR
  Filled 2023-05-10: qty 1

## 2023-05-10 MED ORDER — OXYCODONE-ACETAMINOPHEN 5-325 MG PO TABS: 1.0000 | ORAL_TABLET | Freq: Four times a day (QID) | ORAL | 0 refills | Status: AC | PRN

## 2023-05-10 MED ORDER — METHYLPREDNISOLONE ACETATE 40 MG/ML IJ SUSP
40.0000 mg | Freq: Once | INTRAMUSCULAR | Status: AC
Start: 2023-05-10 — End: 2023-05-10
  Administered 2023-05-10: 40 mg

## 2023-05-10 MED ORDER — IOHEXOL 180 MG/ML  SOLN
10.0000 mL | Freq: Once | INTRAMUSCULAR | Status: AC
  Administered 2023-05-10: 10 mL via INTRA_ARTICULAR
  Filled 2023-05-10: qty 20

## 2023-05-10 MED ORDER — ROPIVACAINE HCL 2 MG/ML IJ SOLN
4.0000 mL | Freq: Once | INTRAMUSCULAR | Status: AC
  Administered 2023-05-10: 4 mL via INTRA_ARTICULAR
  Filled 2023-05-10: qty 20

## 2023-05-10 MED ORDER — LIDOCAINE HCL 2 % IJ SOLN
20.0000 mL | Freq: Once | INTRAMUSCULAR | Status: AC
  Administered 2023-05-10: 400 mg
  Filled 2023-05-10: qty 40

## 2023-05-10 NOTE — Progress Notes (Signed)
PROVIDER NOTE: Interpretation of information contained herein should be left to medically-trained personnel. Specific patient instructions are provided elsewhere under "Patient Instructions" section of medical record. This document was created in part using STT-dictation technology, any transcriptional errors that may result from this process are unintentional.  Patient: Ruth Stout Type: Established DOB: 07/16/1945 MRN: [id number] PCP: Enid Baas, MD  Service: Procedure DOS: 05/10/2023 Setting: Ambulatory Location: Ambulatory outpatient facility Delivery: Face-to-face Provider: Edward Jolly, MD Specialty: Interventional Pain Management Specialty designation: 09 Location: Outpatient facility Ref. Prov.: Enid Baas, MD       Interventional Therapy   Procedure: Glenohumeral Joint (shoulder) Injection #2  Laterality: Bilateral (-50)  Level: Shoulder   Imaging: Fluoroscopy-guided         Anesthesia: Local anesthesia (1-2% Lidocaine) DOS: 05/10/2023  Performed by: Edward Jolly, MD  Purpose: Diagnostic/Therapeutic Indications: Shoulder pain severe enough to impact quality of life or function. Rationale (medical necessity): procedure needed and proper for the diagnosis and/or treatment of [name]'s medical symptoms and needs. 1. Primary osteoarthritis of right shoulder   2. Chronic pain syndrome   3. Lumbar facet arthropathy     NAS-11 Pain score:   Pre-procedure: 7 /10   Post-procedure:  (4 right 7 left)/10      Target: Glenohumeral Joint (shoulder) Location: Intra-articular  Region: Entire Shoulder Area Approach: Anterolateral approach. Type of procedure: Percutaneous joint injection   Position  Prep  Materials:  Position: Supine Prep solution: DuraPrep (Iodine Povacrylex [0.7% available iodine] and Isopropyl Alcohol, 74% w/w) Prep Area: Entire shoulder Area Materials:  Tray: Block Needle(s):  Type: Spinal  Gauge (G): 22  Length: 3.5-in  Qty:  1  H&P (Pre-op Assessment):  Ruth Stout is a 78 y.o. (year old), female patient, seen today for interventional treatment. She  has a past surgical history that includes partial hysterectomy; Appendectomy; Tonsillectomy; Wrist surgery; and tmj implants. Ruth Stout has a current medication list which includes the following prescription(s): amitriptyline, bisacodyl, coenzyme q10, dss, fluticasone, glimepiride, lantus solostar, insulin lispro, levothyroxine, oxycodone-acetaminophen, simvastatin, linzess, and losartan. Her primarily concern today is the Shoulder Pain (bilateral)  Initial Vital Signs:  Pulse/HCG Rate: 87ECG Heart Rate: 89 Temp: (!) 97.2 F (36.2 C) Resp: 18 BP: (!) 149/84 SpO2: 100 %  BMI: Estimated body mass index is 32.69 kg/m as calculated from the following:   Height as of this encounter: 5\' 1"  (1.549 m).   Weight as of this encounter: 173 lb (78.5 kg).  Risk Assessment: Allergies: Reviewed. She is allergic to cephalexin, clarithromycin, codeine, erythromycin base, levofloxacin in d5w, other, penicillins, and sulfa antibiotics.  Allergy Precautions: None required Coagulopathies: Reviewed. None identified.  Blood-thinner therapy: None at this time Active Infection(s): Reviewed. None identified. Ruth Stout is afebrile  Site Confirmation: Ruth Stout was asked to confirm the procedure and laterality before marking the site Procedure checklist: Completed Consent: Before the procedure and under the influence of no sedative(s), amnesic(s), or anxiolytics, the patient was informed of the treatment options, risks and possible complications. To fulfill our ethical and legal obligations, as recommended by the American Medical Association's Code of Ethics, I have informed the patient of my clinical impression; the nature and purpose of the treatment or procedure; the risks, benefits, and possible complications of the intervention; the alternatives, including doing nothing; the  risk(s) and benefit(s) of the alternative treatment(s) or procedure(s); and the risk(s) and benefit(s) of doing nothing. The patient was provided information about the general risks and possible complications associated with the procedure. These  may include, but are not limited to: failure to achieve desired goals, infection, bleeding, organ or nerve damage, allergic reactions, paralysis, and death. In addition, the patient was informed of those risks and complications associated to the procedure, such as failure to decrease pain; infection; bleeding; organ or nerve damage with subsequent damage to sensory, motor, and/or autonomic systems, resulting in permanent pain, numbness, and/or weakness of one or several areas of the body; allergic reactions; (i.e.: anaphylactic reaction); and/or death. Furthermore, the patient was informed of those risks and complications associated with the medications. These include, but are not limited to: allergic reactions (i.e.: anaphylactic or anaphylactoid reaction(s)); adrenal axis suppression; blood sugar elevation that in diabetics may result in ketoacidosis or comma; water retention that in patients with history of congestive heart failure may result in shortness of breath, pulmonary edema, and decompensation with resultant heart failure; weight gain; swelling or edema; medication-induced neural toxicity; particulate matter embolism and blood vessel occlusion with resultant organ, and/or nervous system infarction; and/or aseptic necrosis of one or more joints. Finally, the patient was informed that Medicine is not an exact science; therefore, there is also the possibility of unforeseen or unpredictable risks and/or possible complications that may result in a catastrophic outcome. The patient indicated having understood very clearly. We have given the patient no guarantees and we have made no promises. Enough time was given to the patient to ask questions, all of which were  answered to the patient's satisfaction. Ruth Stout has indicated that she wanted to continue with the procedure. Attestation: I, the ordering provider, attest that I have discussed with the patient the benefits, risks, side-effects, alternatives, likelihood of achieving goals, and potential problems during recovery for the procedure that I have provided informed consent. Date  Time: 05/10/2023  1:03 PM   Imaging Guidance (Non-Spinal):          Type of Imaging Technique: Fluoroscopy Guidance (Non-Spinal) Indication(s): Assistance in needle guidance and placement for procedures requiring needle placement in or near specific anatomical locations not easily accessible without such assistance. Exposure Time: Please see nurses notes. Contrast: None used. Fluoroscopic Guidance: I was personally present during the use of fluoroscopy. "Tunnel Vision Technique" used to obtain the best possible view of the target area. Parallax error corrected before commencing the procedure. "Direction-depth-direction" technique used to introduce the needle under continuous pulsed fluoroscopy. Once target was reached, antero-posterior, oblique, and lateral fluoroscopic projection used confirm needle placement in all planes. Images permanently stored in EMR. Interpretation: No contrast injected. I personally interpreted the imaging intraoperatively. Adequate needle placement confirmed in multiple planes. Permanent images saved into the patient's record.  Pre-Procedure Preparation:  Monitoring: As per clinic protocol. Respiration, ETCO2, SpO2, BP, heart rate and rhythm monitor placed and checked for adequate function Safety Precautions: Patient was assessed for positional comfort and pressure points before starting the procedure. Time-out: I initiated and conducted the "Time-out" before starting the procedure, as per protocol. The patient was asked to participate by confirming the accuracy of the "Time Out" information.  Verification of the correct person, site, and procedure were performed and confirmed by me, the nursing staff, and the patient. "Time-out" conducted as per Joint Commission's Universal Protocol (UP.01.01.01). Time: 1323 Start Time: 1323 hrs.  Description  Narrative of Procedure:          Rationale (medical necessity): procedure needed and proper for the diagnosis and/or treatment of the patient's medical symptoms and needs. Procedural Technique Safety Precautions: Aspiration looking for blood return was conducted prior  to all injections. At no point did we inject any substances, as a needle was being advanced. No attempts were made at seeking any paresthesias. Safe injection practices and needle disposal techniques used. Medications properly checked for expiration dates. SDV (single dose vial) medications used. Description of the Procedure: Protocol guidelines were followed. The patient was placed in position over the procedure table. The target area was identified and the area prepped in the usual manner. Skin & deeper tissues infiltrated with local anesthetic. Appropriate amount of time allowed to pass for local anesthetics to take effect. The procedure needles were then advanced to the target area. Proper needle placement secured. Negative aspiration confirmed. Solution injected in intermittent fashion, asking for systemic symptoms every 0.5cc of injectate. The needles were then removed and the area cleansed, making sure to leave some of the prepping solution back to take advantage of its long term bactericidal properties.   10 cc solution made of 8 cc of 0.2% ropivacaine, 2 cc of methylprednisolone, 40 mg/cc.  5 cc injected into the left shoulder joint, 5 cc injected into the right shoulder joint.            Vitals:   05/10/23 1309 05/10/23 1323 05/10/23 1329  BP: (!) 149/84 (!) 130/115 (!) 134/94  Pulse: 87    Resp: 18 16 17   Temp: (!) 97.2 F (36.2 C)    TempSrc: Temporal    SpO2:  100% 100% 98%  Weight: 173 lb (78.5 kg)    Height: 5\' 1"  (1.549 m)        Start Time: 1323 hrs. End Time: 1328 hrs.   Post-operative Assessment:  Post-procedure Vital Signs:  Pulse/HCG Rate: 8782 Temp: (!) 97.2 F (36.2 C) Resp: 17 BP: (!) 134/94 SpO2: 98 %  EBL: None  Complications: No immediate post-treatment complications observed by team, or reported by patient.  Note: The patient tolerated the entire procedure well. A repeat set of vitals were taken after the procedure and the patient was kept under observation following institutional policy, for this type of procedure. Post-procedural neurological assessment was performed, showing return to baseline, prior to discharge. The patient was provided with post-procedure discharge instructions, including a section on how to identify potential problems. Should any problems arise concerning this procedure, the patient was given instructions to immediately contact us, at any time, without hesitation. In any case, we plan to contact the patient by telephone for a follow-up status report regarding this interventional procedure.  Comments:  No additional relevant information.  Plan of Care (POC)  Orders:  Orders Placed This Encounter  Procedures   DG PAIN CLINIC C-ARM 1-60 MIN NO REPORT    Intraoperative interpretation by procedural physician at [facility].    Standing Status:   Standing    Number of Occurrences:   1    Reason for exam::   Assistance in needle guidance and placement for procedures requiring needle placement in or near specific anatomical locations not easily accessible without such assistance.   DG Lumbar Spine Complete W/Bend    Patient presents with axial pain with possible radicular component. Please assist [place] in identifying specific level(s) and laterality of any additional findings such as: 1. Facet (Zygapophyseal) joint DJD (Hypertrophy, space narrowing, subchondral sclerosis, and/or osteophyte  formation) 2. DDD and/or IVDD (Loss of disc height, desiccation, gas patterns, osteophytes, endplate sclerosis, or "Black disc disease") 3. Pars defects 4. Spondylolisthesis, spondylosis, and/or spondyloarthropathies (include Degree/Grade of displacement in mm) (stability) 5. Vertebral body Fractures (acute/chronic) (  state percentage of collapse) 6. Demineralization (osteopenia/osteoporotic) 7. Bone pathology 8. Foraminal narrowing  9. Surgical changes    Standing Status:   Future    Expiration Date:   06/09/2023    Scheduling Instructions:     Please make sure that the patient understands that this needs to be done as soon as possible. Never have the patient do the imaging "just before the next appointment". Inform patient that having the imaging done within the [facility] will expedite the availability of the results and will provide      imaging availability to the requesting physician. In addition inform the patient that the imaging order has an expiration date and will not be renewed if not done within the active period.    Reason for Exam (SYMPTOM  OR DIAGNOSIS REQUIRED):   Low back pain    Preferred imaging location?:   [facility]    Call Results- Best Contact Number?:   [phone] [facility])    Radiology Contrast Protocol - do NOT remove file path:   [email or web address]    Release to patient:   Immediate   PMP checked and reviewed  Medications ordered for procedure: Meds ordered this encounter  Medications   iohexol (OMNIPAQUE) 180 MG/ML injection 10 mL    Must be Myelogram-compatible. If not available, you may substitute with a water-soluble, non-ionic, hypoallergenic, myelogram-compatible radiological contrast medium.   lidocaine (XYLOCAINE) 2 % (with pres) injection 400 mg   methylPREDNISolone acetate (DEPO-MEDROL) injection 40 mg   ropivacaine (PF) 2 mg/mL (0.2%) (NAROPIN) injection 4 mL   methylPREDNISolone acetate  (DEPO-MEDROL) injection 40 mg   oxyCODONE-acetaminophen (PERCOCET) 5-325 MG tablet    Sig: Take 1 tablet by mouth every 6 (six) hours as needed for severe pain (pain score 7-10).    Dispense:  105 tablet    Refill:  0    Chronic Pain: STOP Act (Not applicable) Fill 1 day early if closed on refill date. Avoid benzodiazepines within 8 hours of opioids   Medications administered: We administered iohexol, lidocaine, methylPREDNISolone acetate, ropivacaine (PF) 2 mg/mL (0.2%), and methylPREDNISolone acetate.  See the medical record for exact dosing, route, and time of administration.  Follow-up plan:   Return in about 4 weeks (around 06/07/2023) for PPE, F2F (review Lumbar spine xray).       Qutenza, right glenohumeral joint injection 08/10/2022.  Helpful. Qutenza #2 11/16/22. B/L Glenohumeral joint 11/30/22     Recent Visits No visits were found meeting these conditions. Showing recent visits within past 90 days and meeting all other requirements Today's Visits Date Type Provider Dept  05/10/23 Procedure visit Edward Jolly, MD [facility]  Showing today's visits and meeting all other requirements Future Appointments Date Type Provider Dept  06/01/23 Appointment Edward Jolly, MD [facility]  06/14/23 Appointment Edward Jolly, MD [facility]  Showing future appointments within next 90 days and meeting all other requirements  Disposition: Discharge home  Discharge (Date  Time): 05/10/2023; 1340 hrs.   Primary Care Physician: Enid Baas, MD Location: [facility] Note by: Edward Jolly, MD (TTS technology used. I apologize for any typographical errors that were not detected and corrected.) Date: 05/10/2023; Time: 1:58 PM  Disclaimer:  Medicine is not an Visual merchandiser. The only guarantee in medicine is that nothing is guaranteed. It is important to note that the decision to proceed with this intervention was based on the  information collected from the patient. The Data and conclusions were  drawn from the patient's questionnaire, the interview, and the physical examination. Because the information was provided in large part by the patient, it cannot be guaranteed that it has not been purposely or unconsciously manipulated. Every effort has been made to obtain as much relevant data as possible for this evaluation. It is important to note that the conclusions that lead to this procedure are derived in large part from the available data. Always take into account that the treatment will also be dependent on availability of resources and existing treatment guidelines, considered by other Pain Management Practitioners as being common knowledge and practice, at the time of the intervention. For Medico-Legal purposes, it is also important to point out that variation in procedural techniques and pharmacological choices are the acceptable norm. The indications, contraindications, technique, and results of the above procedure should only be interpreted and judged by a Board-Certified Interventional Pain Specialist with extensive familiarity and expertise in the same exact procedure and technique.

## 2023-05-10 NOTE — Patient Instructions (Signed)
Pain Management Discharge Instructions  General Discharge Instructions :  If you need to reach your doctor call: Monday-Friday 8:00 am - 4:00 pm at [phone] or toll free [phone].  After clinic hours [phone] to have operator reach doctor.  Bring all of your medication bottles to all your appointments in the pain clinic.  To cancel or reschedule your appointment with Pain Management please remember to call 24 hours in advance to avoid a fee.  Refer to the educational materials which you have been given on: General Risks, I had my Procedure. Discharge Instructions, Post Sedation.  Post Procedure Instructions:  The drugs you were given will stay in your system until tomorrow, so for the next 24 hours you should not drive, make any legal decisions or drink any alcoholic beverages.  You may eat anything you prefer, but it is better to start with liquids then soups and crackers, and gradually work up to solid foods.  Please notify your doctor immediately if you have any unusual bleeding, trouble breathing or pain that is not related to your normal pain.  Depending on the type of procedure that was done, some parts of your body may feel week and/or numb.  This usually clears up by tonight or the next day.  Walk with the use of an assistive device or accompanied by an adult for the 24 hours.  You may use ice on the affected area for the first 24 hours.  Put ice in a Ziploc bag and cover with a towel and place against area 15 minutes on 15 minutes off.  You may switch to heat after 24 hours.

## 2023-05-10 NOTE — Progress Notes (Signed)
Nursing Pain Medication Assessment:  Safety precautions to be maintained throughout the outpatient stay will include: orient to surroundings, keep bed in low position, maintain call bell within reach at all times, provide assistance with transfer out of bed and ambulation.  Medication Inspection Compliance: Pill count conducted under aseptic conditions, in front of the patient. Neither the pills nor the bottle was removed from the patient's sight at any time. Once count was completed pills were immediately returned to the patient in their original bottle.  Medication: Oxycodone/APAP Pill/Patch Count:  38 of 105 pills remain Pill/Patch Appearance: Markings consistent with prescribed medication Bottle Appearance: Standard pharmacy container. Clearly labeled. Filled Date: 23 / 04 / 2024 Last Medication intake:  TodaySafety precautions to be maintained throughout the outpatient stay will include: orient to surroundings, keep bed in low position, maintain call bell within reach at all times, provide assistance with transfer out of bed and ambulation.

## 2023-05-11 ENCOUNTER — Telehealth: Payer: Self-pay

## 2023-05-11 NOTE — Telephone Encounter (Signed)
Post procedure follow up.  LM

## 2023-06-01 ENCOUNTER — Encounter: Payer: Medicare Other | Admitting: Student in an Organized Health Care Education/Training Program

## 2023-06-01 ENCOUNTER — Telehealth: Payer: Self-pay

## 2023-06-14 ENCOUNTER — Encounter: Payer: Self-pay | Admitting: Student in an Organized Health Care Education/Training Program

## 2023-06-14 ENCOUNTER — Telehealth: Payer: Self-pay | Admitting: Student in an Organized Health Care Education/Training Program

## 2023-06-14 ENCOUNTER — Ambulatory Visit
Payer: Medicare Other | Attending: Student in an Organized Health Care Education/Training Program | Admitting: Student in an Organized Health Care Education/Training Program

## 2023-06-14 ENCOUNTER — Inpatient Hospital Stay: Admit: 2023-06-14

## 2023-06-14 VITALS — BP 137/63 | HR 83 | Temp 97.2°F | Resp 16 | Ht 61.0 in | Wt 171.0 lb

## 2023-06-14 DIAGNOSIS — M19012 Primary osteoarthritis, left shoulder: Secondary | ICD-10-CM | POA: Diagnosis present

## 2023-06-14 DIAGNOSIS — M19011 Primary osteoarthritis, right shoulder: Secondary | ICD-10-CM | POA: Diagnosis present

## 2023-06-14 DIAGNOSIS — G894 Chronic pain syndrome: Secondary | ICD-10-CM

## 2023-06-14 DIAGNOSIS — M47816 Spondylosis without myelopathy or radiculopathy, lumbar region: Secondary | ICD-10-CM

## 2023-06-14 DIAGNOSIS — M48061 Spinal stenosis, lumbar region without neurogenic claudication: Secondary | ICD-10-CM

## 2023-06-14 NOTE — Progress Notes (Signed)
 Nursing Pain Medication Assessment:  Safety precautions to be maintained throughout the outpatient stay will include: orient to surroundings, keep bed in low position, maintain call bell within reach at all times, provide assistance with transfer out of bed and ambulation.  Medication Inspection Compliance: Pill count conducted under aseptic conditions, in front of the patient. Neither the pills nor the bottle was removed from the patient's sight at any time. Once count was completed pills were immediately returned to the patient in their original bottle.  Medication: Oxycodone/APAP Pill/Patch Count:  91 of 105 pills remain Pill/Patch Appearance: Markings consistent with prescribed medication Bottle Appearance: Standard pharmacy container. Clearly labeled. Filled Date: 01 / 29 / 2025 Last Medication intake:  Today

## 2023-06-14 NOTE — Patient Instructions (Signed)

## 2023-06-14 NOTE — Telephone Encounter (Signed)
 I do not see any orders for xrays. She may have gone home. Will be glad to call her if you order an xray

## 2023-06-14 NOTE — Telephone Encounter (Signed)
 Patient went over to get her xrays of her lumbar back but there are no orders in. Please ask Dr Cherylann Ratel to put in orders for this. She is waiting at the [facility]

## 2023-06-14 NOTE — Progress Notes (Signed)
 PROVIDER NOTE: Information contained herein reflects review and annotations entered in association with encounter. Interpretation of such information and data should be left to medically-trained personnel. Information provided to patient can be located elsewhere in the medical record under "Patient Instructions". Document created using STT-dictation technology, any transcriptional errors that may result from process are unintentional.    Patient: Ruth Stout  Service Category: E/M  Provider: Edward Jolly, MD  DOB: 08/25/1945  DOS: 06/14/2023  Referring Provider: Enid Baas, MD  MRN: [id number]  Specialty: Interventional Pain Management  PCP: Enid Baas, MD  Type: Established Patient  Setting: Ambulatory outpatient    Location: Office  Delivery: Face-to-face     HPI  Ms. Ruth Stout, a 78 y.o. year old female, is here today because of her Localized osteoarthritis of shoulder regions, bilateral [M19.011, M19.012]. Ruth Stout primary complain today is Shoulder Pain (Bilateral, right is worse), Back Pain (low), and Foot Pain  Pertinent problems: Ruth Stout does not have any pertinent problems on file. Pain Assessment: Severity of Chronic pain is reported as a 6 /10. Location: Shoulder Right, Left/radiates down both arms to wrist. Onset: More than a month ago. Quality: Lambert Mody, Aching, Dull. Timing: Constant. Modifying factor(s): meds. Vitals:  height is 5\' 1"  (1.549 m) and weight is 171 lb (77.6 kg). Her temperature is 97.2 F (36.2 C) (abnormal). Her blood pressure is 137/63 and her pulse is 83. Her respiration is 16 and oxygen saturation is 96%.  BMI: Estimated body mass index is 32.31 kg/m as calculated from the following:   Height as of this encounter: 5\' 1"  (1.549 m).   Weight as of this encounter: 171 lb (77.6 kg). Last encounter: 01/26/2023. Last procedure: 05/10/2023.  Reason for encounter: post-procedure evaluation and assessment.    Post-procedure evaluation     Procedure: Glenohumeral Joint (shoulder) Injection #2  Laterality: Bilateral (-50)  Level: Shoulder   Imaging: Fluoroscopy-guided         Anesthesia: Local anesthesia (1-2% Lidocaine) DOS: 05/10/2023  Performed by: Edward Jolly, MD  Purpose: Diagnostic/Therapeutic Indications: Shoulder pain severe enough to impact quality of life or function. Rationale (medical necessity): procedure needed and proper for the diagnosis and/or treatment of Ruth Stout medical symptoms and needs. 1. Primary osteoarthritis of right shoulder   2. Chronic pain syndrome   3. Lumbar facet arthropathy     NAS-11 Pain score:   Pre-procedure: 7 /10   Post-procedure:  (4 right 7 left)/10       Effectiveness:  Initial hour after procedure: 100 %  Subsequent 4-6 hours post-procedure: 100 %  Analgesia past initial 6 hours: 75 % (lasting a month)  Ongoing improvement:  Analgesic:  50% Function: Somewhat improved ROM: Somewhat improved    ROS  Constitutional: Denies any fever or chills Gastrointestinal: No reported hemesis, hematochezia, vomiting, or acute GI distress Musculoskeletal: Denies any acute onset joint swelling, redness, loss of ROM, or weakness Neurological: No reported episodes of acute onset apraxia, aphasia, dysarthria, agnosia, amnesia, paralysis, loss of coordination, or loss of consciousness  Medication Review  Coenzyme Q10, DSS, amitriptyline, bisacodyl, fluticasone, glimepiride, insulin glargine, insulin lispro, levothyroxine, linaclotide, losartan, and simvastatin  History Review  Allergy: Ruth Stout is allergic to cephalexin, clarithromycin, codeine, erythromycin base, levofloxacin in d5w, other, penicillins, and sulfa antibiotics. Drug: Ruth Stout  reports no history of drug use. Alcohol:  reports that she does not currently use alcohol. Tobacco:  reports that she has never smoked. She has never used smokeless tobacco. Social: Ruth Stout  reports that she has never smoked.  She has never used smokeless tobacco. She reports that she does not currently use alcohol. She reports that she does not use drugs. Medical:  has a past medical history of Arthritis, Diabetes mellitus without complication (HCC), Diabetic neuropathy (HCC), Fibromyalgia, Hypothyroid, Insomnia, and Thyroid disease. Surgical: Ruth Stout  has a past surgical history that includes partial hysterectomy; Appendectomy; Tonsillectomy; Wrist surgery; and tmj implants. Family: family history is not on file. She was adopted.  Laboratory Chemistry Profile   Renal Lab Results  Component Value Date   BUN 16 05/02/2020   CREATININE 0.84 05/02/2020   GFRAA >60 06/23/2019   GFRNONAA >60 05/02/2020    Hepatic Lab Results  Component Value Date   AST 18 05/02/2020   ALT 14 05/02/2020   ALBUMIN 3.3 (L) 05/02/2020   ALKPHOS 63 05/02/2020    Electrolytes Lab Results  Component Value Date   NA 132 (L) 07/12/2021   K 3.4 (L) 05/02/2020   CL 94 (L) 05/02/2020   CALCIUM 9.0 05/02/2020    Bone No results found for: "VD25OH", "VD125OH2TOT", "WU9811BJ4", "NW2956OZ3", "25OHVITD1", "25OHVITD2", "25OHVITD3", "TESTOFREE", "TESTOSTERONE"  Inflammation (CRP: Acute Phase) (ESR: Chronic Phase) No results found for: "CRP", "ESRSEDRATE", "LATICACIDVEN"       Note: Above Lab results reviewed.   Physical Exam  General appearance: Well nourished, well developed, and well hydrated. In no apparent acute distress Mental status: Alert, oriented x 3 (person, place, & time)       Respiratory: No evidence of acute respiratory distress Eyes: PERLA Vitals: BP 137/63   Pulse 83   Temp (!) 97.2 F (36.2 C)   Resp 16   Ht 5\' 1"  (1.549 m)   Wt 171 lb (77.6 kg)   SpO2 96%   BMI 32.31 kg/m  BMI: Estimated body mass index is 32.31 kg/m as calculated from the following:   Height as of this encounter: 5\' 1"  (1.549 m).   Weight as of this encounter: 171 lb (77.6 kg). Ideal: Ideal body weight: 47.8 kg (105 lb 6.1  oz) Adjusted ideal body weight: 59.7 kg (131 lb 10 oz)  Improvement in bilateral shoulder pain and range of motion  Assessment   Diagnosis Status  1. Localized osteoarthritis of shoulder regions, bilateral   2. Primary osteoarthritis of right shoulder   3. Chronic pain syndrome    Controlled Controlled Controlled    Plan of Care  Satisfactory pain relief from previous bilateral glenohumeral joint injections. Continue to monitor, repeat as needed Does not need any medication refills at this time.  Follow-up plan:   Return if symptoms worsen or fail to improve.      Qutenza, right glenohumeral joint injection 08/10/2022.  Helpful. Qutenza #2 11/16/22. B/L Glenohumeral joint 11/30/22      Recent Visits Date Type Provider Dept  05/10/23 Procedure visit Edward Jolly, MD [facility]  Showing recent visits within past 90 days and meeting all other requirements Today's Visits Date Type Provider Dept  06/14/23 Office Visit Edward Jolly, MD [facility]  Showing today's visits and meeting all other requirements Future Appointments No visits were found meeting these conditions. Showing future appointments within next 90 days and meeting all other requirements  I discussed the assessment and treatment plan with the patient. The patient was provided an opportunity to ask questions and all were answered. The patient agreed with the plan and demonstrated an understanding of the instructions.  Patient advised to call back or seek an  in-person evaluation if the symptoms or condition worsens.  Duration of encounter: .  Total time on encounter, as per AMA guidelines included both the face-to-face and non-face-to-face time personally spent by the physician and/or other qualified health care professional(s) on the day of the encounter (includes time in activities that require the physician or other qualified health care professional and does not include time in  activities normally performed by clinical staff). Physician's time may include the following activities when performed: Preparing to see the patient (e.g., pre-charting review of records, searching for previously ordered imaging, lab work, and nerve conduction tests) Review of prior analgesic pharmacotherapies. Reviewing PMP Interpreting ordered tests (e.g., lab work, imaging, nerve conduction tests) Performing post-procedure evaluations, including interpretation of diagnostic procedures Obtaining and/or reviewing separately obtained history Performing a medically appropriate examination and/or evaluation Counseling and educating the patient/family/caregiver Ordering medications, tests, or procedures Referring and communicating with other health care professionals (when not separately reported) Documenting clinical information in the electronic or other health record Independently interpreting results (not separately reported) and communicating results to the patient/ family/caregiver Care coordination (not separately reported)  Note by: Edward Jolly, MD Date: 06/14/2023; Time: 3:52 PM

## 2023-08-10 ENCOUNTER — Encounter: Payer: Self-pay | Admitting: Student in an Organized Health Care Education/Training Program

## 2023-08-13 ENCOUNTER — Encounter: Payer: Self-pay | Admitting: Emergency Medicine

## 2023-08-13 ENCOUNTER — Emergency Department

## 2023-08-13 ENCOUNTER — Other Ambulatory Visit: Payer: Self-pay

## 2023-08-13 ENCOUNTER — Emergency Department
Admission: EM | Admit: 2023-08-13 | Discharge: 2023-08-13 | Disposition: A | Discharge status: Home / Self Care | Attending: Emergency Medicine | Admitting: Emergency Medicine

## 2023-08-13 DIAGNOSIS — S0101XA Laceration without foreign body of scalp, initial encounter: Secondary | ICD-10-CM | POA: Diagnosis not present

## 2023-08-13 DIAGNOSIS — M25551 Pain in right hip: Secondary | ICD-10-CM | POA: Diagnosis not present

## 2023-08-13 DIAGNOSIS — S0990XA Unspecified injury of head, initial encounter: Secondary | ICD-10-CM | POA: Diagnosis present

## 2023-08-13 DIAGNOSIS — W11XXXA Fall on and from ladder, initial encounter: Secondary | ICD-10-CM | POA: Diagnosis not present

## 2023-08-13 DIAGNOSIS — W19XXXA Unspecified fall, initial encounter: Secondary | ICD-10-CM

## 2023-08-13 DIAGNOSIS — E119 Type 2 diabetes mellitus without complications: Secondary | ICD-10-CM | POA: Insufficient documentation

## 2023-08-13 DIAGNOSIS — Y92019 Unspecified place in single-family (private) house as the place of occurrence of the external cause: Secondary | ICD-10-CM | POA: Diagnosis not present

## 2023-08-13 MED ORDER — OXYCODONE HCL 5 MG PO TABS
5.0000 mg | ORAL_TABLET | Freq: Once | ORAL | Status: AC
  Administered 2023-08-13: 5 mg via ORAL
  Filled 2023-08-13: qty 1

## 2023-08-13 NOTE — ED Provider Notes (Signed)
 [facility] Provider Note    Event Date/Time   First MD Initiated Contact with Patient 08/13/23 1409     (approximate)   History   Fall   HPI  Ruth Stout is a 78 y.o. female with type 2 diabetes who comes in for a fall.  Patient reports she was stepping off a ladder when she fell hitting her head on the counter.  Patient reports that she only 1 foot stepstool when it came out from underneath her and tipped over.  She denied any chest pain, shortness of breath, lightheadedness prior to the fall.  Did not blackout.  Did hit her head.  Did report some right hip pain.  Physical Exam   Triage Vital Signs: ED Triage Vitals [08/13/23 1334]  Encounter Vitals Group     BP 121/73     Systolic BP Percentile      Diastolic BP Percentile      Pulse Rate 80     Resp 17     Temp 98.2 F (36.8 C)     Temp Source Oral     SpO2 93 %     Weight 165 lb (74.8 kg)     Height 5\' 1"  (1.549 m)     Head Circumference      Peak Flow      Pain Score 8     Pain Loc      Pain Education      Exclude from Growth Chart     Most recent vital signs: Vitals:   08/13/23 1334  BP: 121/73  Pulse: 80  Resp: 17  Temp: 98.2 F (36.8 C)  SpO2: 93%     General: Awake, no distress.  CV:  Good peripheral perfusion.  Resp:  Normal effort.  Abd:  No distention.  Other:  Pain in the right hip but able to lift up the leg.  No abdominal tenderness no chest wall tenderness no CTL spine tenderness.  Laceration noted on the back of the scalp   ED Results / Procedures / Treatments   Labs (all labs ordered are listed, but only abnormal results are displayed) Labs Reviewed - No data to display     RADIOLOGY I have reviewed the xray personally and interpreted no evidence of any fracture   PROCEDURES:  Critical Care performed: No  .Laceration Repair  Date/Time: 08/13/2023 2:29 PM  Performed by: Lubertha Rush, MD Authorized by: Lubertha Rush, MD   Consent:     Consent obtained:  Verbal   Consent given by:  Patient   Risks discussed:  Infection, pain and retained foreign body   Alternatives discussed:  No treatment Universal protocol:    Patient identity confirmed:  Verbally with patient Anesthesia:    Anesthesia method:  None Laceration details:    Location:  Scalp   Scalp location:  Mid-scalp   Length (cm):  4   Depth (mm):  1 Treatment:    Area cleansed with:  Saline   Irrigation volume:  50cc   Irrigation method:  Pressure wash   Visualized foreign bodies/material removed: no     Debridement:  None Skin repair:    Repair method:  Staples   Number of staples:  4 Approximation:    Approximation:  Close Post-procedure details:    Dressing:  Open (no dressing)   Procedure completion:  Tolerated well, no immediate complications    MEDICATIONS ORDERED IN ED: Medications  oxyCODONE  (Oxy IR/ROXICODONE ) immediate release  tablet 5 mg (has no administration in time range)     IMPRESSION / MDM / ASSESSMENT AND PLAN / ED COURSE  I reviewed the triage vital signs and the nursing notes.   Patient's presentation is most consistent with acute presentation with potential threat to life or bodily function.   Differential is intracranial hemorrhage, cervical fracture, hip fracture.  No syncope distrusting blood work, EKG.  Oxygen level charted at 93% but she states that sometimes it has a difficulty picking up her reads.  Put her back on the pulse ox and it was 100% she denies any shortness of breath has no chest wall tenderness.  No abdominal tenderness.  She reports some chronic pain in her right shoulder but she knows she has to have a rotator cuff surgery.  She declines x-ray today of the shoulder.  No other evidence of injury on examination.  Patient does take chronic opioids at home I given her a dose of oxycodone  here.  She is going to take her baseline medications.  Her CT imaging and x-ray were negative for acute findings.  Her scalp  laceration was repaired with 4 staples that she will have removed in 10 to 14 days.  Recommended Tdap and we discussed the dangers of tetanus patient states that she is not interested in vaccination, tetanus shot today.  She understands that tetanus is deadly but declines at vaccine.    FINAL CLINICAL IMPRESSION(S) / ED DIAGNOSES   Final diagnoses:  Fall, initial encounter  Laceration of scalp without foreign body, initial encounter  Pain of right hip     Rx / DC Orders   ED Discharge Orders     None        Note:  This document was prepared using Dragon voice recognition software and may include unintentional dictation errors.   Lubertha Rush, MD 08/13/23 [phone]

## 2023-08-13 NOTE — ED Notes (Signed)
 See triage note  Presents s/p fall  States she was on a step stool  reaching for snacks    Lost her footing  Hit her head on the counter Laceration to back of head

## 2023-08-13 NOTE — Discharge Instructions (Addendum)
 Return to ER for worsening symptoms, pain or any other concerns.  IMPRESSION: 1. No acute intracranial abnormality or significant interval change. 2. 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  IMPRESSION: 1. No acute fracture or traumatic subluxation. 2. Multilevel degenerative changes of the cervical spine as described.  IMPRESSION: 1. No acute fracture or dislocation. 2. Mild bilateral hip arthritic changes.

## 2023-08-13 NOTE — ED Triage Notes (Signed)
 Patient to ED via POV from home after a fall. Pt reports she was on a step ladder and fell hitting her head on the counter. Laceration to back of head- bleeding controlled. Denies LOC or blood thinners. C/o neck, head, and right hip pain. Aox4.

## 2023-08-14 NOTE — Progress Notes (Unsigned)
 PROVIDER NOTE: Interpretation of information contained herein should be left to medically-trained personnel. Specific patient instructions are provided elsewhere under "Patient Instructions" section of medical record. This document was created in part using AI and STT-dictation technology, any transcriptional errors that may result from this process are unintentional.  Patient: Ruth Stout  Service: E/M   PCP: Rex Castor, MD  DOB: 1945/06/18  DOS: 08/15/2023  Provider: Cherylin Corrigan, NP  MRN: [id number]  Delivery: Face-to-face  Specialty: Interventional Pain Management  Type: Established Patient  Setting: Ambulatory outpatient facility  Specialty designation: 09  Referring Prov.: Rex Castor, MD  Location: Outpatient office facility       HPI  Ruth Stout, a 78 y.o. year old female, is here today because of her Chronic pain syndrome [G89.4]. Ms. Roopnarine primary complain today is Shoulder Pain (Right shoulder, right hip, both legs and feet)   Pain Assessment: Severity of Chronic pain is reported as a 8 /10. Location: Shoulder Right (right shoulder, right hip and bilateral legs and feet)/radiates to right elbow;. Onset: More than a month ago. Quality: Numbness, Sharp. Timing: Constant. Modifying factor(s): meds. Vitals:  height is 5\' 1"  (1.549 m) and weight is 164 lb (74.4 kg). Her temperature is 97.2 F (36.2 C) (abnormal). Her blood pressure is 112/56 (abnormal) and her pulse is 72. Her oxygen saturation is 100%.  BMI: Estimated body mass index is 30.99 kg/m as calculated from the following:   Height as of this encounter: 5\' 1"  (1.549 m).   Weight as of this encounter: 164 lb (74.4 kg). Last encounter: 06/14/2023.  Reason for encounter: medication management. No change in medical history since last visit.  Patient's pain is at baseline.  Patient continues multimodal pain regimen as prescribed.  States that it provides pain relief and improvement in functional status.  The  patient reports chronic right shoulder pain, in addition to discomfort in the right hip both the leg and feet. She has a history of diabetic peripheral neuropathy and received Qutenza  treatment on 08/29/2022, which helped manage her neuropathic symptom.   Of note, she received bilateral shoulder injections on 05/10/2023, which have provided approximately 75% ongoing pain relief.  Pharmacotherapy Assessment  Analgesic: Oxycodone -acetaminophen  (Percocet) 5-325 mg tablet every 6 hours as needed for severe pain. MME=30.29 Monitoring: [place] PMP: PDMP reviewed during this encounter.       Pharmacotherapy: No side-effects or adverse reactions reported. Compliance: No problems identified. Effectiveness: Clinically acceptable.  Merilyn Staple, RN  08/15/2023 10:29 AM  Sign when Signing Visit Safety precautions to be maintained throughout the outpatient stay will include: orient to surroundings, keep bed in low position, maintain call bell within reach at all times, provide assistance with transfer out of bed and ambulation.   Nursing Pain Medication Assessment:  Safety precautions to be maintained throughout the outpatient stay will include: orient to surroundings, keep bed in low position, maintain call bell within reach at all times, provide assistance with transfer out of bed and ambulation.  Medication Inspection Compliance: Pill count conducted under aseptic conditions, in front of the patient. Neither the pills nor the bottle was removed from the patient's sight at any time. Once count was completed pills were immediately returned to the patient in their original bottle.  Medication: Oxycodone /APAP Pill/Patch Count:  1 of 105 pills remain Pill/Patch Appearance: Markings consistent with prescribed medication Bottle Appearance: Standard pharmacy container. Clearly labeled. Filled Date: 1 / 23 / 2025 Last Medication intake:  Today  No results found for: "CBDTHCR" No results found for:  "D8THCCBX" No results found for: "D9THCCBX"  UDS:  Summary  Date Value Ref Range Status  07/26/2022 Note  Final    Comment:    ==================================================================== Compliance Drug Analysis, Ur ==================================================================== Test                             Result       Flag       Units  Drug Present and Declared for Prescription Verification   Amitriptyline                   PRESENT      EXPECTED   Nortriptyline                  PRESENT      EXPECTED    Nortriptyline is an expected metabolite of amitriptyline .  Drug Present not Declared for Prescription Verification   Ibuprofen                      PRESENT      UNEXPECTED   Lidocaine                       PRESENT      UNEXPECTED ==================================================================== Test                      Result    Flag   Units      Ref Range   Creatinine              50               mg/dL      >=78 ==================================================================== Declared Medications:  The flagging and interpretation on this report are based on the  following declared medications.  Unexpected results may arise from  inaccuracies in the declared medications.   **Note: The testing scope of this panel includes these medications:   Amitriptyline  (Elavil )   **Note: The testing scope of this panel does not include the  following reported medications:   Bisacodyl (Dulcolax)  Docusate  Fluticasone (Flonase)  Glimepiride (Amaryl)  Insulin (Lantus)  Levothyroxine (Synthroid)  Linaclotide (Linzess)  Losartan (Cozaar)  Semaglutide  Simvastatin (Zocor)  Ubiquinone (CoQ10) ==================================================================== For clinical consultation, please call [phone]. ====================================================================       ROS  Constitutional: Denies any fever or chills Gastrointestinal: No  reported hemesis, hematochezia, vomiting, or acute GI distress Musculoskeletal: Right shoulder pain, right hip pain, bilateral foot pain  Neurological: No reported episodes of acute onset apraxia, aphasia, dysarthria, agnosia, amnesia, paralysis, loss of coordination, or loss of consciousness  Medication Review  Coenzyme Q10, DSS, amitriptyline , bisacodyl, busPIRone, fluticasone, glimepiride, insulin glargine, insulin lispro, levothyroxine, linaclotide, losartan, oxyCODONE -acetaminophen , simvastatin, and tirzepatide  History Review  Allergy: Ms. [place] is allergic to cephalexin, clarithromycin, codeine, erythromycin base, levofloxacin in d5w, other, penicillins, and sulfa antibiotics. Drug: Ms. Hwa  reports no history of drug use. Alcohol:  reports that she does not currently use alcohol. Tobacco:  reports that she has never smoked. She has never used smokeless tobacco. Social: Ms. Riniker  reports that she has never smoked. She has never used smokeless tobacco. She reports that she does not currently use alcohol. She reports that she does not use drugs. Medical:  has a past medical history of Arthritis, Diabetes mellitus without complication (HCC), Diabetic neuropathy (HCC), Fibromyalgia, Hypothyroid, Insomnia,  and Thyroid  disease. Surgical: Ms. Wolfert  has a past surgical history that includes partial hysterectomy; Appendectomy; Tonsillectomy; Wrist surgery; and tmj implants. Family: family history is not on file. She was adopted.  Laboratory Chemistry Profile   Renal Lab Results  Component Value Date   BUN 16 05/02/2020   CREATININE 0.84 05/02/2020   GFRAA >60 06/23/2019   GFRNONAA >60 05/02/2020    Hepatic Lab Results  Component Value Date   AST 18 05/02/2020   ALT 14 05/02/2020   ALBUMIN 3.3 (L) 05/02/2020   ALKPHOS 63 05/02/2020    Electrolytes Lab Results  Component Value Date   NA 132 (L) 07/12/2021   K 3.4 (L) 05/02/2020   CL 94 (L) 05/02/2020   CALCIUM 9.0  05/02/2020    Bone No results found for: "VD25OH", "VD125OH2TOT", "ZO1096EA5", "WU9811BJ4", "25OHVITD1", "25OHVITD2", "25OHVITD3", "TESTOFREE", "TESTOSTERONE"  Inflammation (CRP: Acute Phase) (ESR: Chronic Phase) No results found for: "CRP", "ESRSEDRATE", "LATICACIDVEN"       Note: Above Lab results reviewed.  Recent Imaging Review  CT Cervical Spine Wo Contrast CLINICAL DATA:  Fall from step ladder.  Hit head on counter.  EXAM: CT CERVICAL SPINE WITHOUT CONTRAST  TECHNIQUE: Multidetector CT imaging of the cervical spine was performed without intravenous contrast. Multiplanar CT image reconstructions were also generated.  RADIATION DOSE REDUCTION: This exam was performed according to the departmental dose-optimization program which includes automated exposure control, adjustment of the mA and/or kV according to patient size and/or use of iterative reconstruction technique.  COMPARISON:  MRI cervical spine 09/12/2020  FINDINGS: Alignment: Grade 1 degenerative anterolisthesis is again noted at C3-4 at C4-5. Minimal anterolisthesis is present at C5-6. Straightening of the normal cervical lordosis is present.  Skull base and vertebrae: The craniocervical junction is within normal limits. The vertebral body heights are normal. No acute fractures are present.  Soft tissues and spinal canal: No prevertebral fluid or swelling. No visible canal hematoma.  Disc levels: Uncovertebral and facet hypertrophy contribute to moderate foraminal narrowing bilaterally, right greater than left at C3-4, left greater than right at C4-5 and bilaterally at C5-6 and C6-7.  Upper chest: The lung apices are clear. The thoracic inlet is within normal limits.  IMPRESSION: 1. No acute fracture or traumatic subluxation. 2. Multilevel degenerative changes of the cervical spine as described.  Electronically Signed   By: Audree Leas M.D.   On: 08/13/2023 14:22 CT Head Wo  Contrast CLINICAL DATA:  Fall from step ladder. Trauma to back of head. Hit head on counter.  EXAM: CT HEAD WITHOUT CONTRAST  TECHNIQUE: Contiguous axial images were obtained from the base of the skull through the vertex without intravenous contrast.  RADIATION DOSE REDUCTION: This exam was performed according to the departmental dose-optimization program which includes automated exposure control, adjustment of the mA and/or kV according to patient size and/or use of iterative reconstruction technique.  COMPARISON:  MRI head without contrast 08/03/2021.  FINDINGS: Brain: No acute infarct, hemorrhage, or mass lesion is present. Cyst mild periventricular white matter changes are stable. The ventricles are of normal size. Deep brain nuclei are within normal limits.  The brainstem and cerebellum are within normal limits. Midline structures are within normal limits.  Vascular: Minimal calcifications are present within the cavernous internal carotid arteries bilaterally. No hyperdense vessel is present.  Skull: Calvarium is intact. No focal lytic or blastic lesions are present. No significant extracranial soft tissue lesion is present.  Sinuses/Orbits: The paranasal sinuses and mastoid air cells are clear. Bilateral  lens replacements are noted. Globes and orbits are otherwise unremarkable.  IMPRESSION: 1. No acute intracranial abnormality or significant interval change. 2. 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  Electronically Signed   By: Audree Leas M.D.   On: 08/13/2023 14:18 DG Hip Unilat W or Wo Pelvis 2-3 Views Right CLINICAL DATA:  Fall and right hip pain.  EXAM: DG HIP (WITH OR WITHOUT PELVIS) 2-3V RIGHT  COMPARISON:  None Available.  FINDINGS: No acute fracture or dislocation. The bones are osteopenic. Mild bilateral hip arthritic changes. The soft tissues are  unremarkable.  IMPRESSION: 1. No acute fracture or dislocation. 2. Mild bilateral hip arthritic changes.  Electronically Signed   By: Angus Bark M.D.   On: 08/13/2023 14:13 Note: Reviewed        Physical Exam  General appearance: Well nourished, well developed, and well hydrated. In no apparent acute distress Mental status: Alert, oriented x 3 (person, place, & time)       Respiratory: No evidence of acute respiratory distress Eyes: PERLA Vitals: BP (!) 112/56   Pulse 72   Temp (!) 97.2 F (36.2 C)   Ht 5\' 1"  (1.549 m)   Wt 164 lb (74.4 kg)   SpO2 100%   BMI 30.99 kg/m  BMI: Estimated body mass index is 30.99 kg/m as calculated from the following:   Height as of this encounter: 5\' 1"  (1.549 m).   Weight as of this encounter: 164 lb (74.4 kg). Ideal: Ideal body weight: 47.8 kg (105 lb 6.1 oz) Adjusted ideal body weight: 58.4 kg (128 lb 13.2 oz)  Assessment   Diagnosis Status  1. Chronic pain syndrome   2. Lumbar facet arthropathy   3. Foraminal stenosis of lumbar region   4. Primary osteoarthritis of right shoulder   5. Localized primary osteoarthritis of shoulder regions, bilateral   6. Encounter for long-term opiate analgesic use   7. Chronic painful diabetic neuropathy (HCC)    Controlled Controlled Controlled   Plan of Care  Assessment and Plan  We will continue on current medication regimen. Prescribing drug monitoring (PDMP) consistent with prescribed medication regimen.  UDS up-to-date.  Patient is advised to monitor symptoms if the right shoulder pain persistent or worsen a repeat injection may be considered for further relief.   Qutenza  treatment has shown benefit for neuropathic symptoms relief and can be repeated in the future as part of ongoing symptoms management.   Pharmacotherapy (Medications Ordered): Meds ordered this encounter  Medications   oxyCODONE -acetaminophen  (PERCOCET) 5-325 MG tablet    Sig: Take 1 tablet by mouth every 6 (six)  hours as needed for up to 26 days for severe pain (pain score 7-10). Must last 7 days.    Dispense:  104 tablet    Refill:  0    For acute post-operative pain. Not to be refilled. Most last 7 days.   oxyCODONE -acetaminophen  (PERCOCET) 5-325 MG tablet    Sig: Take 1 tablet by mouth every 6 (six) hours as needed for up to 26 days for severe pain (pain score 7-10). Must last 7 days.    Dispense:  104 tablet    Refill:  0    For acute post-operative pain. Not to be refilled. Most last 7 days.   oxyCODONE -acetaminophen  (PERCOCET) 5-325 MG tablet    Sig: Take 1 tablet by mouth every 6 (six) hours as needed for up to 26 days for severe pain (pain score 7-10).  Must last 7 days.    Dispense:  104 tablet    Refill:  0    For acute post-operative pain. Not to be refilled. Most last 7 days.   Orders:  No orders of the defined types were placed in this encounter.  Follow-up plan:   Return in about 3 months (around 11/15/2023) for (F2F), (MM), Marthe Slain NP.    Recent Visits Date Type Provider Dept  06/14/23 Office Visit Cephus Collin, MD [facility]  Showing recent visits within past 90 days and meeting all other requirements Today's Visits Date Type Provider Dept  08/15/23 Office Visit Zaryia Markel K, NP [facility]  Showing today's visits and meeting all other requirements Future Appointments No visits were found meeting these conditions. Showing future appointments within next 90 days and meeting all other requirements  I discussed the assessment and treatment plan with the patient. The patient was provided an opportunity to ask questions and all were answered. The patient agreed with the plan and demonstrated an understanding of the instructions.  Patient advised to call back or seek an in-person evaluation if the symptoms or condition worsens.  Duration of encounter: 30 minutes.  Total time on encounter, as per AMA guidelines included both the face-to-face and  non-face-to-face time personally spent by the physician and/or other qualified health care professional(s) on the day of the encounter (includes time in activities that require the physician or other qualified health care professional and does not include time in activities normally performed by clinical staff). Physician's time may include the following activities when performed: Preparing to see the patient (e.g., pre-charting review of records, searching for previously ordered imaging, lab work, and nerve conduction tests) Review of prior analgesic pharmacotherapies. Reviewing PMP Interpreting ordered tests (e.g., lab work, imaging, nerve conduction tests) Performing post-procedure evaluations, including interpretation of diagnostic procedures Obtaining and/or reviewing separately obtained history Performing a medically appropriate examination and/or evaluation Counseling and educating the patient/family/caregiver Ordering medications, tests, or procedures Referring and communicating with other health care professionals (when not separately reported) Documenting clinical information in the electronic or other health record Independently interpreting results (not separately reported) and communicating results to the patient/ family/caregiver Care coordination (not separately reported)  Note by: Jamarkus Lisbon K Sahmya Arai, NP (TTS and AI technology used. I apologize for any typographical errors that were not detected and corrected.) Date: 08/15/2023; Time: 11:41 AM

## 2023-08-15 ENCOUNTER — Encounter: Payer: Self-pay | Admitting: Nurse Practitioner

## 2023-08-15 ENCOUNTER — Ambulatory Visit: Attending: Student in an Organized Health Care Education/Training Program | Admitting: Nurse Practitioner

## 2023-08-15 VITALS — BP 112/56 | HR 72 | Temp 97.2°F | Ht 61.0 in | Wt 164.0 lb

## 2023-08-15 DIAGNOSIS — Z794 Long term (current) use of insulin: Secondary | ICD-10-CM

## 2023-08-15 DIAGNOSIS — G894 Chronic pain syndrome: Secondary | ICD-10-CM | POA: Insufficient documentation

## 2023-08-15 DIAGNOSIS — M48061 Spinal stenosis, lumbar region without neurogenic claudication: Secondary | ICD-10-CM | POA: Insufficient documentation

## 2023-08-15 DIAGNOSIS — M47816 Spondylosis without myelopathy or radiculopathy, lumbar region: Secondary | ICD-10-CM | POA: Insufficient documentation

## 2023-08-15 DIAGNOSIS — Z79891 Long term (current) use of opiate analgesic: Secondary | ICD-10-CM | POA: Diagnosis present

## 2023-08-15 DIAGNOSIS — E1140 Type 2 diabetes mellitus with diabetic neuropathy, unspecified: Secondary | ICD-10-CM | POA: Diagnosis present

## 2023-08-15 DIAGNOSIS — M19011 Primary osteoarthritis, right shoulder: Secondary | ICD-10-CM | POA: Insufficient documentation

## 2023-08-15 DIAGNOSIS — M19012 Primary osteoarthritis, left shoulder: Secondary | ICD-10-CM | POA: Insufficient documentation

## 2023-08-15 MED ORDER — OXYCODONE-ACETAMINOPHEN 5-325 MG PO TABS: 1.0000 | ORAL_TABLET | Freq: Four times a day (QID) | ORAL | 0 refills | Status: AC | PRN

## 2023-08-15 NOTE — Progress Notes (Signed)
 Safety precautions to be maintained throughout the outpatient stay will include: orient to surroundings, keep bed in low position, maintain call bell within reach at all times, provide assistance with transfer out of bed and ambulation.   Nursing Pain Medication Assessment:  Safety precautions to be maintained throughout the outpatient stay will include: orient to surroundings, keep bed in low position, maintain call bell within reach at all times, provide assistance with transfer out of bed and ambulation.  Medication Inspection Compliance: Pill count conducted under aseptic conditions, in front of the patient. Neither the pills nor the bottle was removed from the patient's sight at any time. Once count was completed pills were immediately returned to the patient in their original bottle.  Medication: Oxycodone /APAP Pill/Patch Count:  1 of 105 pills remain Pill/Patch Appearance: Markings consistent with prescribed medication Bottle Appearance: Standard pharmacy container. Clearly labeled. Filled Date: 1 / 59 / 2025 Last Medication intake:  Today

## 2023-10-05 IMAGING — MR MR HEAD WO/W CM
15 series · 48 of 48 positions shown · IV contrast (gadavist)
Comparison: MR head 06/23/2019

CLINICAL DATA: Facial tic with facial pain facial movements, and
facial tense in

EXAM:
MRI HEAD WITHOUT AND WITH CONTRAST
TECHNIQUE: Multiplanar, multiecho pulse sequences of the brain and surrounding
structures were obtained without and with intravenous contrast.
CONTRAST:  7mL GADAVIST GADOBUTROL 1 MMOL/ML IV SOLN

[Series 5: ax dwi_tracew · axial · 3.0mm · 0.65mm/px · z∈[-78,+77]mm · 2 of 48 slices shown]
[im 1/48]
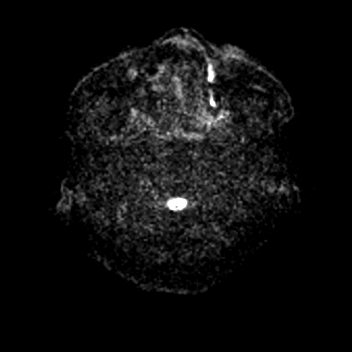
[im 48/48]
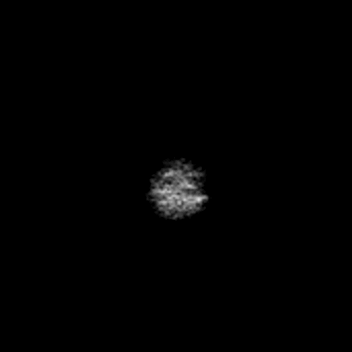

[Series 6: ax dwi_adc · axial · 3.0mm · 0.65mm/px · z∈[-78,+77]mm · 3 of 48 slices shown]
[im 1/48]
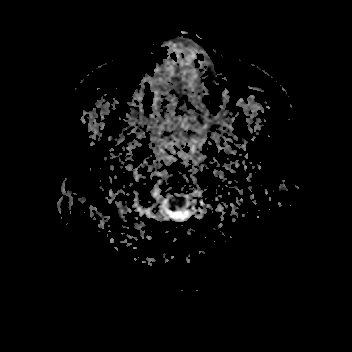
[im 24/48]
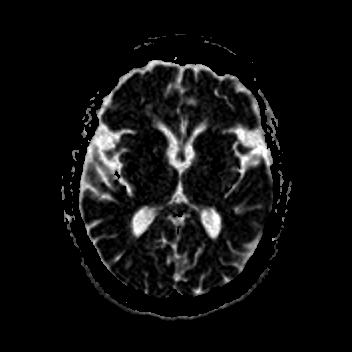
[im 48/48]
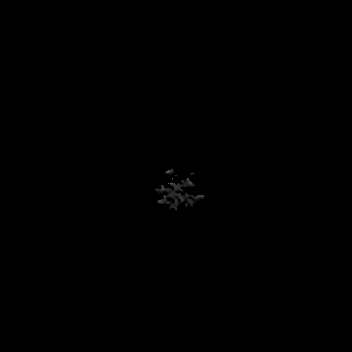

[Series 7: cor dwi_tracew · coronal · 5.0mm · 0.65mm/px · 2 of 40 slices shown]
[im 1/40]
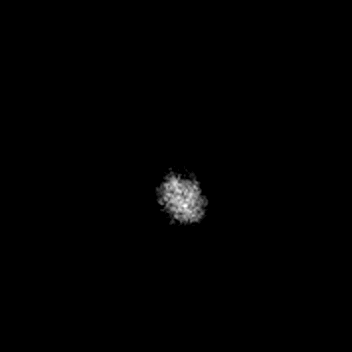
[im 40/40]
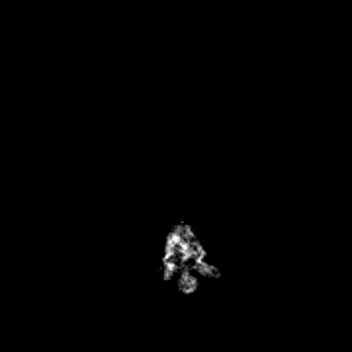

[Series 8: cor dwi_adc · coronal · 5.0mm · 0.65mm/px · 2 of 39 slices shown]
[im 1/39]
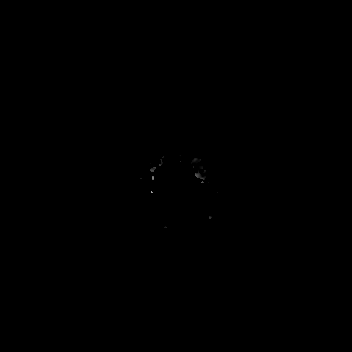
[im 39/39]
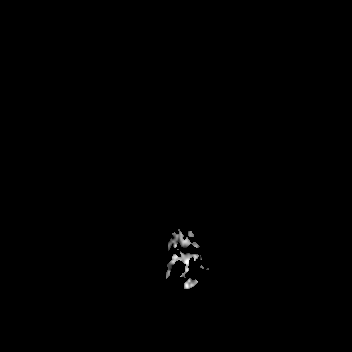

[Series 9: T1 · sagittal · 5.0mm · 0.62mm/px · 1 of 24 slices shown (1 of 2)]
[im 1/24]
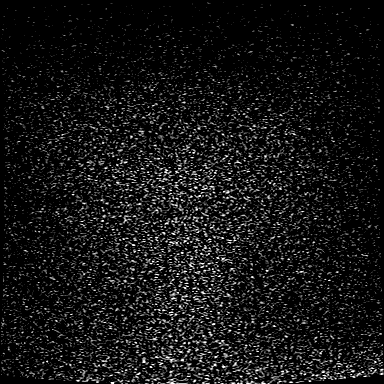

[Series 10: T2 · axial · 5.0mm · 0.53mm/px · 1 of 26 slices shown]
[im 1/26]
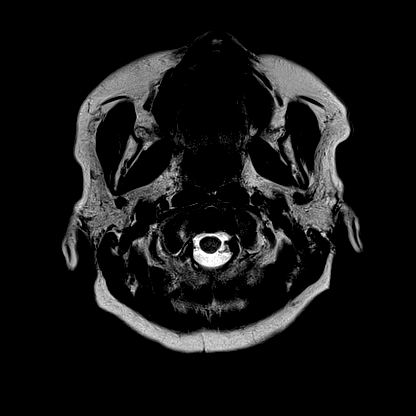

[Series 11: mag_images · axial · 3.0mm · 0.90mm/px · z∈[-89,+88]mm · 3 of 60 slices shown]
[im 1/60]
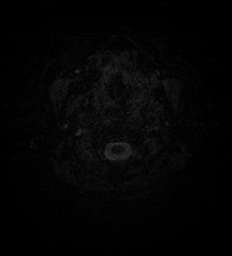
[im 30/60]
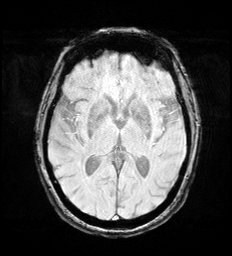
[im 60/60]
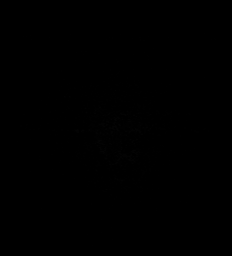

[Series 12: pha_images · axial · 3.0mm · 0.90mm/px · z∈[-89,+88]mm · 3 of 58 slices shown]
[im 1/58]
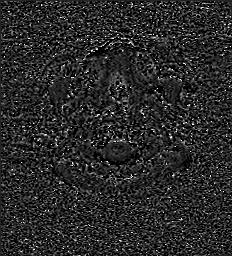
[im 29/58]
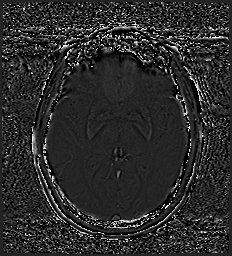
[im 58/58]
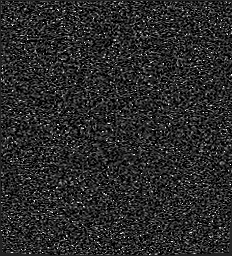

[Series 13: swi_images · axial · 3.0mm · 0.90mm/px · z∈[-89,+88]mm · 3 of 60 slices shown]
[im 1/60]
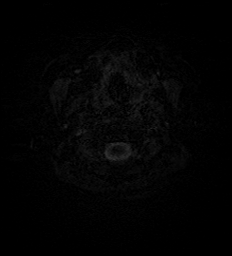
[im 30/60]
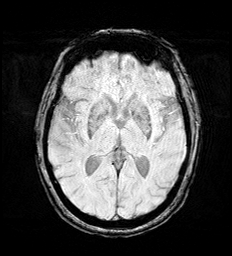
[im 60/60]
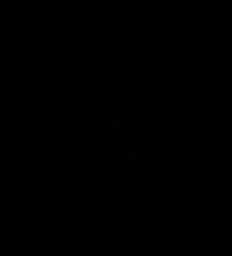

[Series 14: mip_images(sw) · axial · 24.0mm · 0.90mm/px · z∈[-79,+77]mm · 3 of 53 slices shown]
[im 1/53]
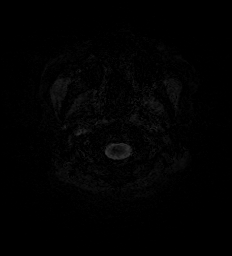
[im 27/53]
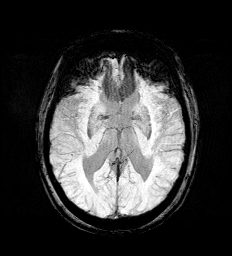
[im 53/53]
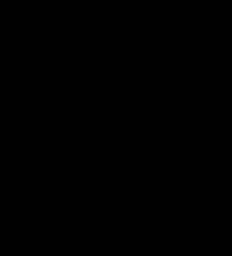

[Series 15: FLAIR · axial · 3.0mm · 0.53mm/px · z∈[-82,+80]mm · 3 of 55 slices shown]
[im 1/55]
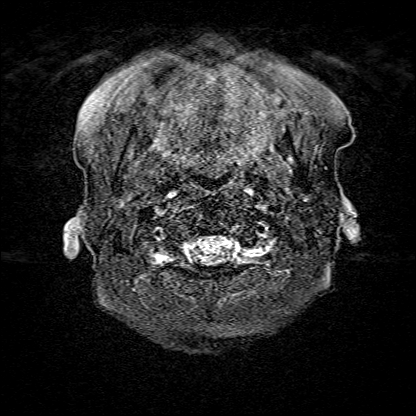
[im 28/55]
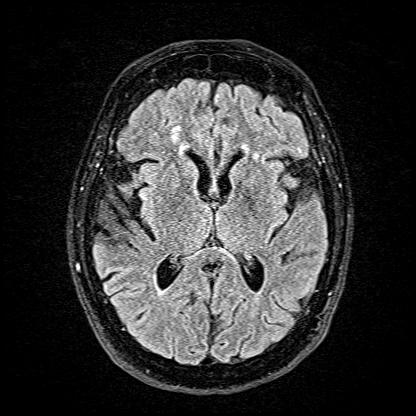
[im 55/55]
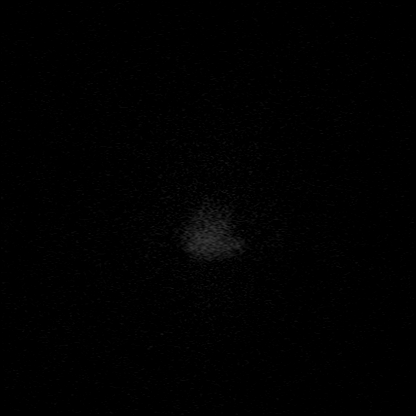

[Series 16: T1 · axial · 1.0mm · 0.98mm/px · z∈[-87,+87]mm · 9 of 176 slices shown (2 of 2)]
[im 1/176]
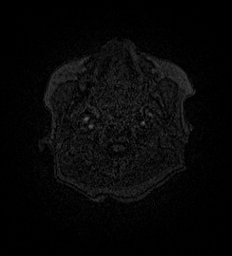
[im 22/176]
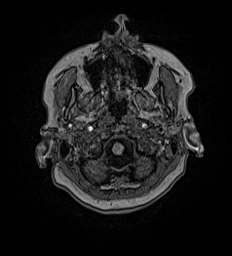
[im 44/176]
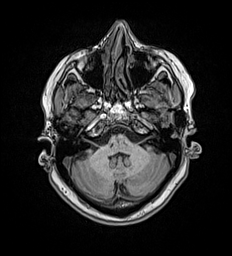
[im 66/176]
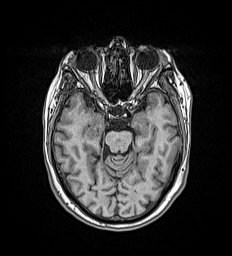
[im 88/176]
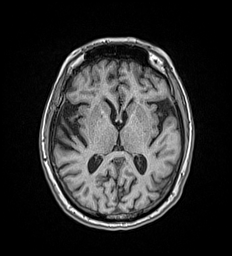
[im 110/176]
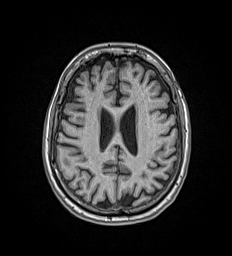
[im 132/176]
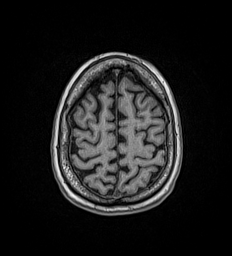
[im 154/176]
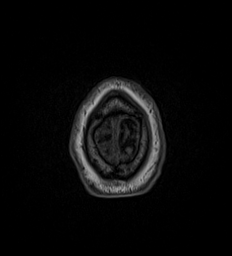
[im 176/176]
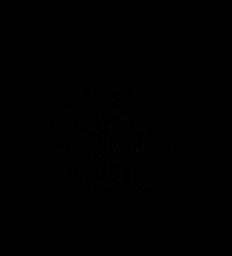

[Series 17: T2 post-contrast · coronal · 5.0mm · 0.57mm/px · 2 of 29 slices shown]
[im 1/29]
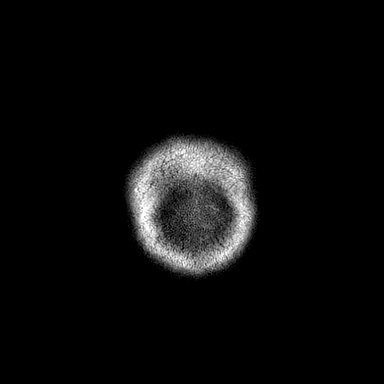
[im 29/29]
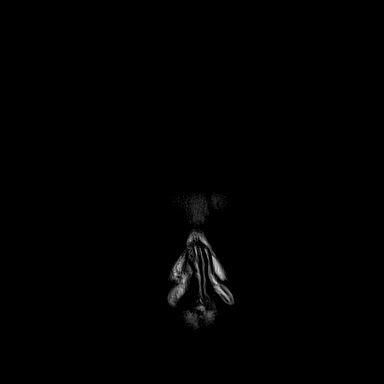

[Series 18: T1 post-contrast · axial · 1.0mm · 0.98mm/px · z∈[-87,+87]mm · 9 of 176 slices shown (1 of 2)]
[im 1/176]
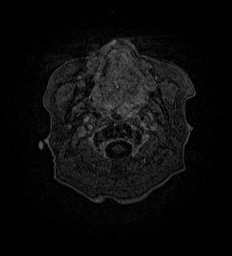
[im 22/176]
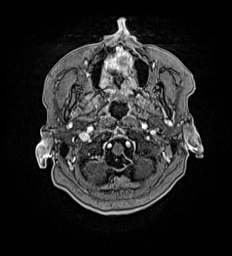
[im 44/176]
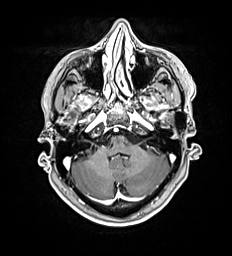
[im 66/176]
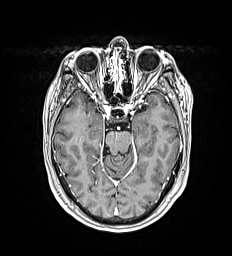
[im 88/176]
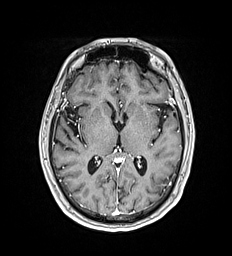
[im 110/176]
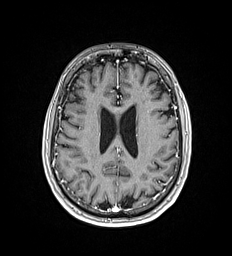
[im 132/176]
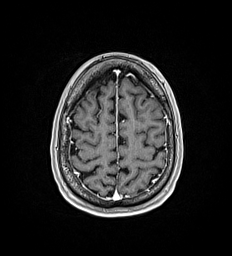
[im 154/176]
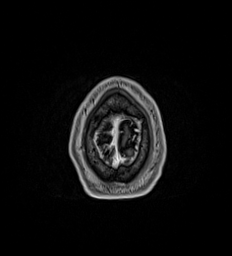
[im 176/176]
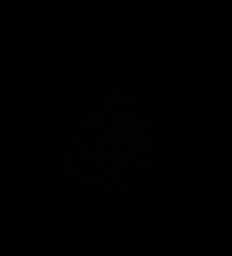

[Series 19: T1 post-contrast · coronal · 5.0mm · 0.57mm/px · 2 of 29 slices shown (2 of 2)]
[im 1/29]
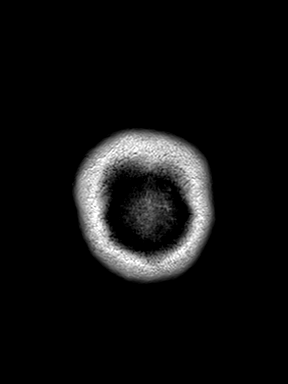
[im 29/29]
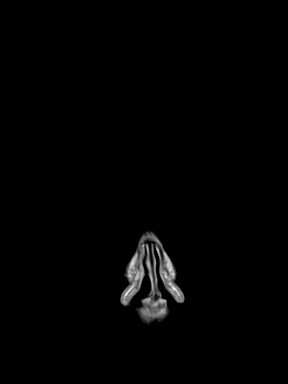

[48 of 48 positions shown; findings below may reference images not displayed]

FINDINGS: Brain: There is no acute intracranial hemorrhage, extra-axial fluid
collection, or acute infarct.

Parenchymal volume is normal. The ventricles are normal in size.
Gray-white differentiation is preserved. There are scattered small
foci of FLAIR signal abnormality in the subcortical and
periventricular white matter, nonspecific but likely reflecting
sequela of mild chronic white matter microangiopathy and not
significantly changed since 1013.

There is no abnormal finding or enhancement along the course of the
trigeminal nerves.

There is no suspicious parenchymal signal abnormality. There is no
mass lesion. There is no abnormal enhancement. There is no mass
effect or midline shift.

Vascular: Normal flow voids.

Skull and upper cervical spine: Normal marrow signal.

Sinuses/Orbits: There is mild mucosal thickening in the paranasal
sinuses. Bilateral lens implants are in place. The globes and orbits
are otherwise unremarkable.

Other: None.
IMPRESSION: No acute intracranial pathology. Mild chronic white matter
microangiopathy, not significantly changed since [DATE].

## 2023-11-15 ENCOUNTER — Ambulatory Visit: Attending: Nurse Practitioner | Admitting: Nurse Practitioner

## 2023-11-15 ENCOUNTER — Encounter: Payer: Self-pay | Admitting: Nurse Practitioner

## 2023-11-15 VITALS — BP 118/67 | HR 84 | Temp 97.3°F | Ht 61.0 in | Wt 166.0 lb

## 2023-11-15 DIAGNOSIS — M75101 Unspecified rotator cuff tear or rupture of right shoulder, not specified as traumatic: Secondary | ICD-10-CM | POA: Diagnosis present

## 2023-11-15 DIAGNOSIS — M19012 Primary osteoarthritis, left shoulder: Secondary | ICD-10-CM | POA: Insufficient documentation

## 2023-11-15 DIAGNOSIS — M12811 Other specific arthropathies, not elsewhere classified, right shoulder: Secondary | ICD-10-CM | POA: Insufficient documentation

## 2023-11-15 DIAGNOSIS — M47816 Spondylosis without myelopathy or radiculopathy, lumbar region: Secondary | ICD-10-CM | POA: Diagnosis present

## 2023-11-15 DIAGNOSIS — M19011 Primary osteoarthritis, right shoulder: Secondary | ICD-10-CM | POA: Insufficient documentation

## 2023-11-15 DIAGNOSIS — Z79891 Long term (current) use of opiate analgesic: Secondary | ICD-10-CM | POA: Insufficient documentation

## 2023-11-15 DIAGNOSIS — G894 Chronic pain syndrome: Secondary | ICD-10-CM | POA: Insufficient documentation

## 2023-11-15 DIAGNOSIS — M48061 Spinal stenosis, lumbar region without neurogenic claudication: Secondary | ICD-10-CM | POA: Insufficient documentation

## 2023-11-15 MED ORDER — OXYCODONE-ACETAMINOPHEN 5-325 MG PO TABS: 1.0000 | ORAL_TABLET | Freq: Four times a day (QID) | ORAL | 0 refills | Status: AC | PRN

## 2023-11-15 MED ORDER — OXYCODONE-ACETAMINOPHEN 5-325 MG PO TABS
1.0000 | ORAL_TABLET | Freq: Four times a day (QID) | ORAL | 0 refills | Status: AC | PRN
Start: 2023-11-26 — End: 2023-12-22

## 2023-11-15 NOTE — Progress Notes (Signed)
 PROVIDER NOTE: Interpretation of information contained herein should be left to medically-trained personnel. Specific patient instructions are provided elsewhere under Patient Instructions section of medical record. This document was created in part using AI and STT-dictation technology, any transcriptional errors that may result from this process are unintentional.  Patient: Ruth Stout  Service: E/M   PCP: Sherial Bail, MD  DOB: May 20, 1945  DOS: 11/15/2023  Provider: Emmy MARLA Blanch, NP  MRN: [id number]  Delivery: Face-to-face  Specialty: Interventional Pain Management  Type: Established Patient  Setting: Ambulatory outpatient facility  Specialty designation: 09  Referring Prov.: Sherial Bail, MD  Location: Outpatient office facility       History of present illness (HPI) Ruth Stout, a 78 y.o. year old female, is here today because of her Chronic pain syndrome [G89.4]. Ruth Stout primary complain today is Shoulder Pain (right)  Pertinent problems: Ruth Stout has Diabetic peripheral neuropathy associated with type 2 diabetes mellitus; Diabetic neuropathy; Diabetic cataract; Crohn's disease; Chronic fatigue syndrome; Chronic anxiety; Right rotator cuff tear arthropathy; Foraminal stenosis of lumbar region; Lumbar facet arthropathy; Encounter for long-term opiate analgesic use; and chronic pain syndrome on their pertinent problem list  Pain Assessment: Severity of Chronic pain is reported as a 8 /10. Location: Shoulder Right/radiates down left arm to wrist. Onset: More than a month ago. Quality: Stabbing. Timing: Constant. Modifying factor(s): meds; red light therapy. Vitals:  height is 5' 1 (1.549 m) and weight is 166 lb (75.3 kg). Her temperature is 97.3 F (36.3 C) (abnormal). Her blood pressure is 118/67 and her pulse is 84. Her oxygen saturation is 100%.  BMI: Estimated body mass index is 31.37 kg/m as calculated from the following:   Height as of this encounter: 5' 1  (1.549 m).   Weight as of this encounter: 166 lb (75.3 kg).  Last encounter: 08/15/2023. Last procedure: 05/10/2023  Reason for encounter: medication management. No change in medical history since last visit.  Patient's pain is at baseline.  Patient continues multimodal pain regimen as prescribed.  States that it provides pain relief and improvement in functional status.  The patient reports chronic right shoulder pain, in addition to discomfort in the right hip both the leg and feet. She has a history of diabetic peripheral neuropathy and received Qutenza  treatment on 08/29/2022, which helped manage her neuropathic symptom.    Of note, she received bilateral shoulder injections on 05/10/2023, which have provided approximately 75% ongoing pain relief, however the patient prefers to wait until the end of the year or early next year before proceeding with the shoulder injection.  Pharmacotherapy Assessment   Oxycodone -acetaminophen  (Percocet) 5-325 mg tablet every 6 hours as needed for severe pain. MME=30 Monitoring: [place] PMP: PDMP reviewed during this encounter.       Pharmacotherapy: No side-effects or adverse reactions reported. Compliance: No problems identified. Effectiveness: Clinically acceptable.  Margrette Nathanel PARAS, RN  11/15/2023 10:17 AM  Sign when Signing Visit Safety precautions to be maintained throughout the outpatient stay will include: orient to surroundings, keep bed in low position, maintain call bell within reach at all times, provide assistance with transfer out of bed and ambulation.   Nursing Pain Medication Assessment:  Safety precautions to be maintained throughout the outpatient stay will include: orient to surroundings, keep bed in low position, maintain call bell within reach at all times, provide assistance with transfer out of bed and ambulation.  Medication Inspection Compliance: Pill count conducted under aseptic conditions, in front of the patient.  Neither the pills nor  the bottle was removed from the patient's sight at any time. Once count was completed pills were immediately returned to the patient in their original bottle.  Medication: Oxycodone /APAP Pill/Patch Count: 65.5 of 104 pills/patches remain Pill/Patch Appearance: Markings consistent with prescribed medication Bottle Appearance: Standard pharmacy container. Clearly labeled. Filled Date: 7 / 22 / 2025 Last Medication intake:  Today    UDS:  Summary  Date Value Ref Range Status  07/26/2022 Note  Final    Comment:    ==================================================================== Compliance Drug Analysis, Ur ==================================================================== Test                             Result       Flag       Units  Drug Present and Declared for Prescription Verification   Amitriptyline                   PRESENT      EXPECTED   Nortriptyline                  PRESENT      EXPECTED    Nortriptyline is an expected metabolite of amitriptyline .  Drug Present not Declared for Prescription Verification   Ibuprofen                      PRESENT      UNEXPECTED   Lidocaine                       PRESENT      UNEXPECTED ==================================================================== Test                      Result    Flag   Units      Ref Range   Creatinine              50               mg/dL      >=79 ==================================================================== Declared Medications:  The flagging and interpretation on this report are based on the  following declared medications.  Unexpected results may arise from  inaccuracies in the declared medications.   **Note: The testing scope of this panel includes these medications:   Amitriptyline  (Elavil )   **Note: The testing scope of this panel does not include the  following reported medications:   Bisacodyl (Dulcolax)  Docusate  Fluticasone (Flonase)  Glimepiride (Amaryl)  Insulin (Lantus)   Levothyroxine (Synthroid)  Linaclotide (Linzess)  Losartan (Cozaar)  Semaglutide  Simvastatin (Zocor)  Ubiquinone (CoQ10) ==================================================================== For clinical consultation, please call [phone]. ====================================================================     No results found for: CBDTHCR No results found for: D8THCCBX No results found for: D9THCCBX  ROS  Constitutional: Denies any fever or chills Gastrointestinal: No reported hemesis, hematochezia, vomiting, or acute GI distress Musculoskeletal: Bilateral shoulder pain (R>L) Neurological: No reported episodes of acute onset apraxia, aphasia, dysarthria, agnosia, amnesia, paralysis, loss of coordination, or loss of consciousness  Medication Review  Coenzyme Q10, DSS, amitriptyline , bisacodyl, busPIRone, fluticasone, glimepiride, insulin glargine, insulin lispro, levothyroxine, linaclotide, losartan, oxyCODONE -acetaminophen , simvastatin, and tirzepatide  History Review  Allergy: Ruth Stout is allergic to cephalexin, clarithromycin, codeine, erythromycin base, levofloxacin in d5w, other, penicillins, and sulfa antibiotics. Drug: Ruth Stout  reports no history of drug use. Alcohol:  reports that she does not currently use alcohol. Tobacco:  reports that she has  never smoked. She has never used smokeless tobacco. Social: Ruth Stout  reports that she has never smoked. She has never used smokeless tobacco. She reports that she does not currently use alcohol. She reports that she does not use drugs. Medical:  has a past medical history of Arthritis, Diabetes mellitus without complication (HCC), Diabetic neuropathy (HCC), Fibromyalgia, Hypothyroid, Insomnia, and Thyroid  disease. Surgical: Ruth Stout  has a past surgical history that includes partial hysterectomy; Appendectomy; Tonsillectomy; Wrist surgery; and tmj implants. Family: family history is not on file. She was  adopted.  Laboratory Chemistry Profile   Renal Lab Results  Component Value Date   BUN 16 05/02/2020   CREATININE 0.84 05/02/2020   GFRAA >60 06/23/2019   GFRNONAA >60 05/02/2020    Hepatic Lab Results  Component Value Date   AST 18 05/02/2020   ALT 14 05/02/2020   ALBUMIN 3.3 (L) 05/02/2020   ALKPHOS 63 05/02/2020    Electrolytes Lab Results  Component Value Date   NA 132 (L) 07/12/2021   K 3.4 (L) 05/02/2020   CL 94 (L) 05/02/2020   CALCIUM 9.0 05/02/2020    Bone No results found for: VD25OH, VD125OH2TOT, CI6874NY7, CI7874NY7, 25OHVITD1, 25OHVITD2, 25OHVITD3, TESTOFREE, TESTOSTERONE  Inflammation (CRP: Acute Phase) (ESR: Chronic Phase) No results found for: CRP, ESRSEDRATE, LATICACIDVEN       Note: Above Lab results reviewed.  Recent Imaging Review  CT Cervical Spine Wo Contrast CLINICAL DATA:  Fall from step ladder.  Hit head on counter.  EXAM: CT CERVICAL SPINE WITHOUT CONTRAST  TECHNIQUE: Multidetector CT imaging of the cervical spine was performed without intravenous contrast. Multiplanar CT image reconstructions were also generated.  RADIATION DOSE REDUCTION: This exam was performed according to the departmental dose-optimization program which includes automated exposure control, adjustment of the mA and/or kV according to patient size and/or use of iterative reconstruction technique.  COMPARISON:  MRI cervical spine 09/12/2020  FINDINGS: Alignment: Grade 1 degenerative anterolisthesis is again noted at C3-4 at C4-5. Minimal anterolisthesis is present at C5-6. Straightening of the normal cervical lordosis is present.  Skull base and vertebrae: The craniocervical junction is within normal limits. The vertebral body heights are normal. No acute fractures are present.  Soft tissues and spinal canal: No prevertebral fluid or swelling. No visible canal hematoma.  Disc levels: Uncovertebral and facet hypertrophy contribute  to moderate foraminal narrowing bilaterally, right greater than left at C3-4, left greater than right at C4-5 and bilaterally at C5-6 and C6-7.  Upper chest: The lung apices are clear. The thoracic inlet is within normal limits.  IMPRESSION: 1. No acute fracture or traumatic subluxation. 2. Multilevel degenerative changes of the cervical spine as described.  Electronically Signed   By: Lonni Necessary M.D.   On: 08/13/2023 14:22 CT Head Wo Contrast CLINICAL DATA:  Fall from step ladder. Trauma to back of head. Hit head on counter.  EXAM: CT HEAD WITHOUT CONTRAST  TECHNIQUE: Contiguous axial images were obtained from the base of the skull through the vertex without intravenous contrast.  RADIATION DOSE REDUCTION: This exam was performed according to the departmental dose-optimization program which includes automated exposure control, adjustment of the mA and/or kV according to patient size and/or use of iterative reconstruction technique.  COMPARISON:  MRI head without contrast 08/03/2021.  FINDINGS: Brain: No acute infarct, hemorrhage, or mass lesion is present. Cyst mild periventricular white matter changes are stable. The ventricles are of normal size. Deep brain nuclei are within normal limits.  The brainstem and cerebellum  are within normal limits. Midline structures are within normal limits.  Vascular: Minimal calcifications are present within the cavernous internal carotid arteries bilaterally. No hyperdense vessel is present.  Skull: Calvarium is intact. No focal lytic or blastic lesions are present. No significant extracranial soft tissue lesion is present.  Sinuses/Orbits: The paranasal sinuses and mastoid air cells are clear. Bilateral lens replacements are noted. Globes and orbits are otherwise unremarkable.  IMPRESSION: 1. No acute intracranial abnormality or significant interval change. 2. Stable mild periventricular white matter disease. This  likely reflects the sequela of chronic microvascular ischemia.  Electronically Signed   By: Lonni Necessary M.D.   On: 08/13/2023 14:18 DG Hip Unilat W or Wo Pelvis 2-3 Views Right CLINICAL DATA:  Fall and right hip pain.  EXAM: DG HIP (WITH OR WITHOUT PELVIS) 2-3V RIGHT  COMPARISON:  None Available.  FINDINGS: No acute fracture or dislocation. The bones are osteopenic. Mild bilateral hip arthritic changes. The soft tissues are unremarkable.  IMPRESSION: 1. No acute fracture or dislocation. 2. Mild bilateral hip arthritic changes.  Electronically Signed   By: Vanetta Chou M.D.   On: 08/13/2023 14:13 Note: Reviewed        Physical Exam  Vitals: BP 118/67   Pulse 84   Temp (!) 97.3 F (36.3 C)   Ht 5' 1 (1.549 m)   Wt 166 lb (75.3 kg)   SpO2 100%   BMI 31.37 kg/m  BMI: Estimated body mass index is 31.37 kg/m as calculated from the following:   Height as of this encounter: 5' 1 (1.549 m).   Weight as of this encounter: 166 lb (75.3 kg). Ideal: Ideal body weight: 47.8 kg (105 lb 6.1 oz) Adjusted ideal body weight: 58.8 kg (129 lb 10 oz) General appearance: Well nourished, well developed, and well hydrated. In no apparent acute distress Mental status: Alert, oriented x 3 (person, place, & time)       Respiratory: No evidence of acute respiratory distress Eyes: PERLA   Assessment   Diagnosis Status  1. Chronic pain syndrome   2. Lumbar facet arthropathy   3. Foraminal stenosis of lumbar region   4. Primary osteoarthritis of right shoulder   5. Encounter for long-term opiate analgesic use   6. Localized primary osteoarthritis of shoulder regions, bilateral   7. Right rotator cuff tear arthropathy    Controlled Controlled Controlled   Updated Problems: No problems updated.  Plan of Care  Problem-specific:  Assessment and Plan We will continue on current medication regimen.  Prescribing drug monitoring (PDMP) reviewed; findings consistent with  the use of prescribed medication and no evidence of narcotic misuse or abuse. Routine UDS ordered today.  No other new issues or problems reported to this visit.  Schedule follow-up in 90 days for medication management.   Ruth Stout has a current medication list which includes the following long-term medication(s): amitriptyline , fluticasone, glimepiride, lantus solostar, insulin lispro, levothyroxine, losartan, simvastatin, and linzess.  Pharmacotherapy (Medications Ordered): Meds ordered this encounter  Medications   oxyCODONE -acetaminophen  (PERCOCET/ROXICET) 5-325 MG tablet    Sig: Take 1 tablet by mouth every 6 (six) hours as needed for up to 26 days for severe pain (pain score 7-10).    Dispense:  104 tablet    Refill:  0   oxyCODONE -acetaminophen  (PERCOCET/ROXICET) 5-325 MG tablet    Sig: Take 1 tablet by mouth every 6 (six) hours as needed for up to 26 days for severe pain (pain score 7-10).  Dispense:  104 tablet    Refill:  0   oxyCODONE -acetaminophen  (PERCOCET/ROXICET) 5-325 MG tablet    Sig: Take 1 tablet by mouth every 6 (six) hours as needed for up to 26 days for severe pain (pain score 7-10).    Dispense:  104 tablet    Refill:  0   Orders:  Orders Placed This Encounter  Procedures   ToxASSURE Select 13 (MW), Urine    Volume: 30 ml(s). Minimum 3 ml of urine is needed. Document temperature of fresh sample. Indications: Long term (current) use of opiate analgesic (S20.108)    Release to patient:   Immediate        Return in about 3 months (around 02/15/2024) for (F2F), (MM), Emmy Blanch NP.    Recent Visits No visits were found meeting these conditions. Showing recent visits within past 90 days and meeting all other requirements Today's Visits Date Type Provider Dept  11/15/23 Office Visit Holt Woolbright K, NP [facility]  Showing today's visits and meeting all other requirements Future Appointments No visits were found meeting these  conditions. Showing future appointments within next 90 days and meeting all other requirements  I discussed the assessment and treatment plan with the patient. The patient was provided an opportunity to ask questions and all were answered. The patient agreed with the plan and demonstrated an understanding of the instructions.  Patient advised to call back or seek an in-person evaluation if the symptoms or condition worsens.  Duration of encounter: 30 minutes.  Total time on encounter, as per AMA guidelines included both the face-to-face and non-face-to-face time personally spent by the physician and/or other qualified health care professional(s) on the day of the encounter (includes time in activities that require the physician or other qualified health care professional and does not include time in activities normally performed by clinical staff). Physician's time may include the following activities when performed: Preparing to see the patient (e.g., pre-charting review of records, searching for previously ordered imaging, lab work, and nerve conduction tests) Review of prior analgesic pharmacotherapies. Reviewing PMP Interpreting ordered tests (e.g., lab work, imaging, nerve conduction tests) Performing post-procedure evaluations, including interpretation of diagnostic procedures Obtaining and/or reviewing separately obtained history Performing a medically appropriate examination and/or evaluation Counseling and educating the patient/family/caregiver Ordering medications, tests, or procedures Referring and communicating with other health care professionals (when not separately reported) Documenting clinical information in the electronic or other health record Independently interpreting results (not separately reported) and communicating results to the patient/ family/caregiver Care coordination (not separately reported)  Note by: Indria Bishara K Candise Crabtree, NP (TTS and AI technology used. I apologize  for any typographical errors that were not detected and corrected.) Date: 11/15/2023; Time: 10:47 AM

## 2023-11-15 NOTE — Progress Notes (Signed)
 Safety precautions to be maintained throughout the outpatient stay will include: orient to surroundings, keep bed in low position, maintain call bell within reach at all times, provide assistance with transfer out of bed and ambulation.   Nursing Pain Medication Assessment:  Safety precautions to be maintained throughout the outpatient stay will include: orient to surroundings, keep bed in low position, maintain call bell within reach at all times, provide assistance with transfer out of bed and ambulation.  Medication Inspection Compliance: Pill count conducted under aseptic conditions, in front of the patient. Neither the pills nor the bottle was removed from the patient's sight at any time. Once count was completed pills were immediately returned to the patient in their original bottle.  Medication: Oxycodone /APAP Pill/Patch Count: 65.5 of 104 pills/patches remain Pill/Patch Appearance: Markings consistent with prescribed medication Bottle Appearance: Standard pharmacy container. Clearly labeled. Filled Date: 7 / 22 / 2025 Last Medication intake:  Today

## 2023-11-18 LAB — TOXASSURE SELECT 13 (MW), URINE

## 2024-01-29 NOTE — Progress Notes (Signed)
 Ruth Stout is a 78 y.o. here for Medicare Wellness Visit and annual physical  MEDICARE WELLNESS VISIT Providers Rendering Care 1. Dr. Lavenia Beaver (PCP) 2.  Endocrinology 3.  Orthopedics 4.  Neurology 5.  Psychiatry 6.  Pain clinic  Functional Assessment  (1) Hearing: Demonstrates mild difficulty in hearing during normal conversation-not interested in pursuing hearing aids (2) Risk of Falls: Indicates that she has had some falls on uneven surfaces at home.  Gait today is steady unassisted as she walked to the exam room.  She does have a cane and looking for a walker to walk outside for long distances. (3) Home Safety: Patient feels secure in their home, There are operational smoke alarms in multiple areas of the home (4) Activities of Daily Living: Independently manages personal grooming and household chores, including cooking, cleaning and laundry. Manages Personal finances without assistance.  PHQ 2/9 from today's flowsheet  PHQ-2 PHQ-2 Over the last 2 weeks, how often have you been bothered by any of the following problems? Little interest or pleasure in doing things: (Patient-Rptd) More than half the days Feeling down, depressed, or hopeless: (Patient-Rptd) More than half the days Patient Health Questionnaire-2 Score: (Patient-Rptd) 4  PHQ-9 (if PHQ >=3) PHQ-9 Over the last 2 weeks, how often have you been bothered by any of the following problems? Trouble falling or staying asleep, or sleeping too much: (Patient-Rptd) Several days Feeling tired or having little energy: (Patient-Rptd) Several days Poor appetite or overeating: (Patient-Rptd) Nearly every day Feeling bad about yourself - or that you are a failure or have let yourself or your family down: (Patient-Rptd) More than half the days Trouble concentrating on things, such as reading the newspaper or watching television: (Patient-Rptd) Several days Moving or speaking so slowly that other people could have noticed? Or  the opposite - being so fidgety or restless that you have been moving around a lot more than usual.: (Patient-Rptd) Not at all Thoughts that you would be better off dead or hurting yourself in some way: (Patient-Rptd) Several days Patient Health Questionnaire-9 Score: (Patient-Rptd) 13  Depression Severity and Treatment Recommendations:  0-4= None  5-9= Mild / Treatment: Support, educate to call if worse; return in one month  10-14= Moderate / Treatment: Support, watchful waiting; Antidepressant or Psychotherapy  15-19= Moderately severe / Treatment: Antidepressant OR Psychotherapy  >= 20 = Major depression, severe / Antidepressant AND Psychotherapy  Suicide Prevention Interventions    ASQ Suicide-Screening Questions 1. In the past few weeks, have you wished you were dead?: (Patient-Rptd) Y 2. In the past few weeks, have you felt that you or your family would be better off if you were dead?: (Patient-Rptd) N 3. In the past week, have you been having thoughts about killing yourself?: (Patient-Rptd) N 4. Have you ever tried to kill yourself?: (Patient-Rptd) N 5. Are you having thoughts of killing yourself right now?: (Patient-Rptd) N Suicide Risk Screening Result: (Patient-Rptd) Non-Acute Positive (potential risk)   [facility] has a Non-acute Positive Suicide Risk Screen (potential risk identified) but they are not currently having thoughts to kill themselves.       Safety Plan (English) <redacted file path>  Safety Plan (Spanish) <redacted file path> ASQ Suicide-Screening Questions 1. In the past few weeks, have you wished you were dead?: (Patient-Rptd) Y 2. In the past few weeks, have you felt that you or your family would be better off if you were dead?: (Patient-Rptd) N 3. In the past week, have you been having thoughts about killing  yourself?: (Patient-Rptd) N 4. Have you ever tried to kill yourself?: (Patient-Rptd) N 5. Are you having thoughts of killing yourself right now?:  (Patient-Rptd) N Suicide Risk Screening Result: (Patient-Rptd) Non-Acute Positive (potential risk)   [facility] has a Non-acute Positive Suicide Risk Screen (potential risk identified) but they are not currently having thoughts to kill themselves.      Safety Plan (English) <redacted file path>  Safety Plan (Spanish) <redacted file path>     Depression Screening PHQ 2/9 last 3 flowsheet values    11/05/2021   10:48 AM 11/14/2022   11:02 AM 01/29/2024   12:59 PM  PHQ-2/9 Depression Screening   Little interest or pleasure in doing things  0 2  Feeling down, depressed, or hopeless  0 2  Patient Health Questionnaire-2 Score  0 * 4 *  Trouble falling or staying asleep, or sleeping too much   1  Feeling tired or having little energy   1  Poor appetite or overeating   3  Feeling bad about yourself - or that you are a failure or have let yourself or your family down   2  Trouble concentrating on things, such as reading the newspaper or watching television   1  Moving or speaking so slowly that other people could have noticed? Or the opposite - being so fidgety or restless that you have been moving around a lot more than usual.   0  Thoughts that you would be better off dead or hurting yourself in some way   1  Patient Health Questionnaire-9 Score   13 *  (OBSOLETE) Little interest or pleasure in doing things 0    (OBSOLETE) Feeling down, depressed, or hopeless (or irritable for Teens only)? 0    (OBSOLETE) Total Prescreening Score 0    (OBSOLETE) Total Score = 0      * Patient-reported  * Data saved with a previous flowsheet row definition    Depression Severity and Treatment Recommendations:  0-4= None  5-9= Mild / Treatment: Support, educate to call if worse; return in one month  10-14= Moderate / Treatment: Support, watchful waiting; Antidepressant or Psychotherapy  15-19= Moderately severe / Treatment: Antidepressant OR Psychotherapy  >= 20 = Major depression, severe /  Antidepressant AND Psychotherapy   Cognitive Impairment Patient denies episodes of loosing things, being forgetful. Seems oriented to person, place and time. Responses appear appropriate and timely to this observer.    * No data to display           PREVENTION PLAN Cardiovascular:  Lab Results  Component Value Date   LDLCALC 98 07/19/2023   Diabetes:  Lab Results  Component Value Date   HGBA1C 8.0 (H) 01/29/2024   HGBA1C 7.1 (H) 07/19/2023   Glaucoma: UTD  Smoking Cessation:  Not Applicable  Other Personalized Health Advice Encouraged patient to exercise 5 days a week, walking, water aerobics, gentle stretching recommended. Increase dietary intake of fresh fruits and vegetables, reduce red meat to twice a week.   Goals     . * Maintain health/healthy lifestyle (pt-stated)        End of Life Counseling Patient has living will in place; POA - ; Full Code  Current Outpatient Medications  Medication Sig Dispense Refill  . amitriptyline  (ELAVIL ) 75 MG tablet Take 1 tablet (75 mg total) by mouth at bedtime 90 tablet 1  . bisacodyL (DULCOLAX) 10 mg suppository Place 10 mg rectally once daily as needed for Constipation    .  blood-glucose sensor (DEXCOM G7 SENSOR) Devi Use 1 each every 10 (ten) days 3 each 12  . buPROPion (WELLBUTRIN XL) 150 MG XL tablet Take 1 tablet (150 mg total) by mouth once daily 30 tablet 2  . busPIRone (BUSPAR) 5 MG tablet Take 1 tablet (5 mg total) by mouth 2 (two) times daily 180 tablet 0  . coenzyme Q10-vitamin E 100-5 mg-unit Cap Take by mouth    . dihydroberberine (BERBERINE ES-5 ORAL) Take by mouth    . fluticasone propionate (FLONASE) 50 mcg/actuation nasal spray Place 1 spray into both nostrils once daily 16 g 5  . HUMALOG KWIKPEN INSULIN pen injector (concentration 100 units/mL) INJECT SUBCUTANEOUSLY 5 UNITS 3  TIMES DAILY WITH MEALS 15 mL 2  . LANTUS SOLOSTAR U-100 INSULIN pen injector (concentration 100 units/mL) INJECT SUBCUTANEOUSLY 40  UNITS  AT BEDTIME 45 mL 2  . losartan (COZAAR) 50 MG tablet TAKE 1 TABLET BY MOUTH ONCE  DAILY 100 tablet 2  . OMEGA-3-EPA-FISH OIL ORAL Take 1,640 mg by mouth    . oxyCODONE -acetaminophen  (PERCOCET) 5-325 mg tablet Take 1 tablet by mouth every 8 (eight) hours as needed for Pain     No current facility-administered medications for this visit.    Allergies as of 01/29/2024 - Reviewed 01/29/2024  Allergen Reaction Noted  . Biaxin [clarithromycin] Unknown 08/31/2020  . Cephalexin Unknown 07/15/2020  . Codeine Unknown 07/15/2020  . Levofloxacin Unknown 07/15/2020  . Penicillin Unknown 07/15/2020  . Penicillins Other (See Comments) 02/08/2021  . Sulfa (sulfonamide antibiotics) Unknown 07/15/2020    Patient Active Problem List  Diagnosis  . Chronic anxiety  . Diabetic peripheral neuropathy associated with type 2 diabetes mellitus (CMS/HHS-HCC)  . Gastroesophageal reflux disease  . Hypothyroidism  . Osteoarthritis  . Pure hypercholesterolemia  . Abnormal level of blood mineral  . Chronic fatigue syndrome  . Crohn's disease (CMS/HHS-HCC)  . Depression  . Diabetic cataract (CMS/HHS-HCC)  . Diabetic neuropathy (CMS/HHS-HCC)  . GAD (generalized anxiety disorder)  . Hyperlipidemia  . Iron excess  . Long term (current) use of insulin (CMS/HHS-HCC)  . 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)  . 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CMS/HHS-HCC)  . Mood disorder in conditions classified elsewhere  . Chronic post-traumatic stress disorder (PTSD)  . Lumbar facet arthropathy  . Right rotator cuff tear arthropathy  . Foraminal stenosis of lumbar region  . Chronic pain syndrome    Past Medical History:  Diagnosis Date  . Allergy   . Chronic anxiety 11/10/2016   Last Assessment & Plan:  Formatting of this note might be different from the original. Aware  . Chronic kidney disease 2012   Has resolved  . Crohn's disease (CMS/HHS-HCC)   . Depression 1970  . Diabetes mellitus without  complication (CMS/HHS-HCC)   . Diabetic peripheral neuropathy associated with type 2 diabetes mellitus (CMS/HHS-HCC) 09/24/2018   Last Assessment & Plan:  Formatting of this note might be different from the original. Diabetes is worsening.  Continue current treatment regimen. Diabetes will be reassessed in 3 months.  . Gastroesophageal reflux disease 11/10/2016  . Heart murmur 1999  . History of cataract 2020   Surgery mid 2020  . Hypothyroidism 11/10/2016   Last Assessment & Plan:  Formatting of this note might be different from the original. Aware- checking labs  . Meniere disease   . Neuropathy due to secondary diabetes (CMS/HHS-HCC)   . Osteoarthritis 11/10/2016  . Osteoporosis 1995  . Pure hypercholesterolemia 05/28/2018   Last Assessment & Plan:  Formatting of this note might be different from the original. Lipid abnormalities are unchanged. Pharmacotherapy as ordered. Lipids will be reassessed in 6 months.    Past Surgical History:  Procedure Laterality Date  . APPENDECTOMY  Age 10  . CATARACT EXTRACTION  09/2019  . EYE SURGERY     cataract removal  . FRACTURE SURGERY  2017   Wrist  . HYSTERECTOMY    . JOINT REPLACEMENT  1985   TMJ  . other     TMJ implants and removal  . TONSILLECTOMY  78 yrs old  . TONSILLECTOMY & ADENOIDECTOMY      Health Maintenance  Topic Date Due  . Colorectal Cancer Screening  Never done  . Shingrix (1 of 2) Never done  . RSV Immunization Pregnant or 60+ (1 - 1-dose 75+ series) Never done  . COVID-19 Vaccine (2 - 2025-26 season) 12/11/2023  . Influenza Vaccine (1) 12/11/2023  . Lipid Panel  07/18/2024  . Annual Urine Albumin Creatinine Ratio  07/18/2024  . Monofilament Foot Exam  07/25/2024  . Hemoglobin A1C  07/29/2024  . Diabetes Eye Assessment Exam  11/27/2024  . Potassium Level  01/28/2025  . TSH Level  01/28/2025  . Diabetes Education  01/28/2025  . Depression Screening  01/28/2025  . Medicare Subsequent AWV [id number]  01/29/2025  .  Annual Physical/Well Child Check  01/29/2025  . DXA Bone Density Scan  05/05/2026  . Adult Tetanus (Td And Tdap)  12/11/2028  . Pneumococcal Vaccine: 50+  Completed  . Hib Vaccines  Aged Out  . Hepatitis A Vaccines  Aged Out  . Meningococcal B Vaccine  Aged Out  . Meningococcal ACWY Vaccine  Aged Out  . HPV Vaccines  Aged Out    Social History:  reports that she quit smoking about 30 years ago. Her smoking use included cigarettes. She started smoking about 31 years ago. She has never used smokeless tobacco. She reports that she does not currently use alcohol. She reports that she does not currently use drugs.  Vitals:   01/29/24 1405  BP: 122/56  Pulse: 94  SpO2: 98%  Weight: 77.6 kg (171 lb)  Height: 156.2 cm (5' 1.5)  PainSc: 0-No pain   Body mass index is 31.79 kg/m.   Goals     . * Maintain health/healthy lifestyle (pt-stated)         Subjective:  History of Present Illness Isabelle Matt is a 78 year old female who presents for annual wellness visit and physical She is a retired Engineer, civil (consulting).  Currently living with her oldest daughter and her middle daughter also moved in.  Youngest daughter live in [place]   She experiences chronic pain, particularly in her shoulders and back, which has worsened significantly. She is currently taking oxycodone  for pain management and has received shoulder injections in the past, providing temporary relief. She is limited to a certain number of injections per year, with the last one administered in January. She has fallen multiple times in the past year, including a fall from a ladder resulting in a scalp laceration requiring four staples and right hip pain.  She had an emergency room visit for the same in May 2025.  Her depression is severe and worsening, exacerbated by inactivity and dissatisfaction with her living situation. She previously found relief from depression through work and is considering part-time employment to help manage her  symptoms. She has tried counseling and life coaching in the past but found them unhelpful. She is currently taking  Buspar for anxiety and has tried Zoloft  without success. She lacks motivation and purpose, contributing to her depressive symptoms.  She has a history of right shoulder issues, and reports that her surgeon told her she does not have a rotator cuff and needs a shoulder replacement, which limits her ability to perform daily activities such as cooking and personal grooming. She reports difficulty sleeping on her right side due to pain and tenderness.  She has diabetes and is currently managing it with insulin, taking 40 units at night and 5 units before each meal. She has stopped taking glimepiride due to side effects. Her A1c was last checked six months ago and was gradually increasing. She finds temporary relief from pain when eating ice cream, which she describes as the only time she feels good.  She has a history of mild hearing loss but is not interested in pursuing a hearing aid. She reports waking up in the early morning hours with her mind racing, affecting her sleep quality.    ROS:  General: No fever, chills or recent illness. No change in weight Skin:   No skin lesions, growths, masses, rashes, pruritus  HEENT: No change in vision, decreased hearing bilaterally. no pain or difficulty with swallowing Respiratory: No cough or shortness of breath CV:  No chest pain or palpitations GI:  No pain, dyspepsia or change in bowel habits GU:  No dysuria, frequency, or hesitancy MSK:  No joint pain or injury Neurological: No headaches, changes in mental status, loss of sensation or strength  Endocrine:  No heat or cold intolerance, polydipsia, polyuria Psychological: No anxiety.  Does have symptoms of depression leading to insomnia  Objective:   Body mass index is 31.79 kg/m.  BP 122/56   Pulse 94   Ht 156.2 cm (5' 1.5)   Wt 77.6 kg (171 lb)   SpO2 98%   BMI 31.79 kg/m    General: WD/WN female, in no acute distress HEENT: Pupils equal and round, EOMI.  OP moist without lesions Neck: supple, trachea midline; no thyromegaly Chest: normal to palpation Lungs:clear to auscultation without wheeze or retraction Cardiac:  Regular rate and rhythm without murmur, gallops, or rubs Vascular: No carotid bruit and radials 2+; distal pulses 2+ Abdominal:soft, nontender, positive bowel sounds.  No guarding or rebound tenderness Extremities:  No clubbing, cyanosis or edema Neuro: CN grossly intact.  Gait intact.  No acute motor or sensory deficits Dermatologic: no significant rashes or nodules Lymph: no cervical or supraclavicular lymphadenopathy   Assessment/Plan:   Medicare annual wellness visit, subsequent  (primary encounter diagnosis) Depression screening (Z13.31) Annual physical exam Fibromyalgia Mixed hyperlipidemia Hypothyroidism, unspecified type Type 2 diabetes mellitus with diabetic cataract, without long-term current use of insulin (CMS/HHS-HCC) Chronic anxiety Current severe episode of major depressive disorder without psychotic features, unspecified whether recurrent (CMS/HHS-HCC) Encounter for long-term (current) use of medications  Assessment and Plan  Assessment & Plan    1. Medicare wellness visit and annual physical-  -Discussed diet activity and living situation - Medications and allergies reviewed. Copy of preventative health provided.   - Labs reviewed.  Labs ordered -Addressed health maintenance. - not interested in any immunizations -Addressed acute concerns   2.  Uncontrolled type 2 diabetes- -following with endocrinology -A1c today.  Does have Dexcom monitor on and encouraged to follow-up with endocrinology. -Has had side effects with oral medications especially glimepiride. -Remains on Lantus, taking up to 40 units daily. Also on Humalog insulin sliding scale   -Monofilament exam is  normal. - Diabetes education provided and  monitor   3.  Chronic pain syndrome- -Multiple joint aches and pains. -Has degenerative arthritis as well. - Follows with the pain clinic. - Received injections through them in both shoulders as well. - Right shoulder still has decreased range of motion and was advised that she will need surgery.  -Continue oxycodone     4. Major depressive disorder, recurrent, severe, without psychotic features - Recurrent severe depression impacting daily functioning. Previous Zoloft  trials ineffective. Passive death thoughts without active suicidal ideation.  -Patient indicates that she is looking for temporary employment as that is the only thing that will keep her from being depressed - Extensive counseling and education provided. - Prescribe Wellbutrin and monitor for side effects. - Referral to psychiatrist and a therapist has been sent  5. Generalized anxiety disorder Chronic anxiety exacerbating mental health burden. Currently on Buspar.   6. Recurrent falls and mobility impairment - Recurrent falls due to balance issues and neuropathy. Current rollator difficult to transport due to arm strength limitations. - Evaluate for more portable mobility device. - Consider physical therapy for balance and strength training.  7.  Chronic insomnia Chronic insomnia managed with amitriptyline . Early waking with difficulty returning to sleep. - Continue amitriptyline .    Follow-up with me in 6 to 8 weeks for follow-up of depression    RADHIKA KALISETTI, MD    *Some images could not be shown.

## 2024-02-15 ENCOUNTER — Encounter: Admitting: Nurse Practitioner

## 2024-02-21 ENCOUNTER — Encounter: Payer: Self-pay | Admitting: Nurse Practitioner

## 2024-02-21 ENCOUNTER — Ambulatory Visit: Attending: Nurse Practitioner | Admitting: Nurse Practitioner

## 2024-02-21 VITALS — BP 134/72 | HR 85 | Temp 97.9°F | Resp 18 | Ht 61.0 in | Wt 173.0 lb

## 2024-02-21 DIAGNOSIS — Z79899 Other long term (current) drug therapy: Secondary | ICD-10-CM | POA: Diagnosis present

## 2024-02-21 DIAGNOSIS — G894 Chronic pain syndrome: Secondary | ICD-10-CM | POA: Diagnosis present

## 2024-02-21 DIAGNOSIS — M19012 Primary osteoarthritis, left shoulder: Secondary | ICD-10-CM | POA: Diagnosis present

## 2024-02-21 DIAGNOSIS — M19011 Primary osteoarthritis, right shoulder: Secondary | ICD-10-CM | POA: Insufficient documentation

## 2024-02-21 DIAGNOSIS — M75101 Unspecified rotator cuff tear or rupture of right shoulder, not specified as traumatic: Secondary | ICD-10-CM | POA: Insufficient documentation

## 2024-02-21 DIAGNOSIS — M12811 Other specific arthropathies, not elsewhere classified, right shoulder: Secondary | ICD-10-CM | POA: Insufficient documentation

## 2024-02-21 DIAGNOSIS — Z79891 Long term (current) use of opiate analgesic: Secondary | ICD-10-CM | POA: Insufficient documentation

## 2024-02-21 MED ORDER — OXYCODONE-ACETAMINOPHEN 5-325 MG PO TABS: 1.0000 | ORAL_TABLET | Freq: Four times a day (QID) | ORAL | 0 refills | Status: AC | PRN

## 2024-02-21 MED ORDER — NALOXONE HCL 4 MG/0.1ML NA LIQD: 1.0000 | NASAL | 1 refills | Status: AC | PRN

## 2024-02-21 MED ORDER — OXYCODONE-ACETAMINOPHEN 5-325 MG PO TABS
1.0000 | ORAL_TABLET | Freq: Four times a day (QID) | ORAL | 0 refills | Status: AC | PRN
Start: 2024-04-27 — End: 2024-05-23

## 2024-02-21 NOTE — Progress Notes (Signed)
 PROVIDER NOTE: Interpretation of information contained herein should be left to medically-trained personnel. Specific patient instructions are provided elsewhere under Patient Instructions section of medical record. This document was created in part using AI and STT-dictation technology, any transcriptional errors that may result from this process are unintentional.  Patient: Ruth Stout  Service: E/M   PCP: Sherial Bail, MD  DOB: June 06, 1945  DOS: 02/21/2024  Provider: Emmy MARLA Blanch, NP  MRN: [id number]  Delivery: Face-to-face  Specialty: Interventional Pain Management  Type: Established Patient  Setting: Ambulatory outpatient facility  Specialty designation: 09  Referring Prov.: Sherial Bail, MD  Location: Outpatient office facility       History of present illness (HPI) Ms. LARRA CRUNKLETON, a 78 y.o. year old female, is here today because of her bilateral shoulder pain. Ms. Brodbeck primary complain today is Shoulder Pain  Pertinent problems: Ms. Bernardi does not have any pertinent problems on file.  Pain Assessment: Severity of Chronic pain is reported as a 8 /10. Location: Shoulder Left, Right/Down arms and hand. Onset: Other (comment). Quality: Numbness, Tingling, Aching, Sharp. Timing: Constant. Modifying factor(s): Medication. Vitals:  height is 5' 1 (1.549 m) and weight is 173 lb (78.5 kg). Her temporal temperature is 97.9 F (36.6 C). Her blood pressure is 134/72 and her pulse is 85. Her respiration is 18.  BMI: Estimated body mass index is 32.69 kg/m as calculated from the following:   Height as of this encounter: 5' 1 (1.549 m).   Weight as of this encounter: 173 lb (78.5 kg).  Last encounter: 11/15/2023. Last procedure: Visit date not found.  Reason for encounter: evaluation for possible interventional PM therapy/treatment and medication management.   Discussed the use of AI scribe software for clinical note transcription with the patient, who gave verbal  consent to proceed.  History of Present Illness   Ruth Stout is a 78 year old female who presents for pain management. No change in medical history since last visit.  Patient's pain is at baseline.  Patient continues multimodal pain regimen as prescribed.  States that it provides pain relief and improvement in functional status.   She continues experiencing bilateral shoulder pain (R>L), describes sharp pain in her right shoulder, which has intensified over the past three to four days, previously being dull. She attributes the left shoulder pain to sleeping in an awkward position and has difficulty raising her arm across her chest. She was previously referred to a surgeon but has not pursued surgery due to concerns about her blood sugar levels. She received a shoulder injection in January 2025 and is considering another injection due to flare up from past three to four days.   She also experiences back pain, which she attributes in part to carrying extra weight. She is currently taking Percocet 5-325 mg more frequently due to increased pain, finding that one and a half tablets provide better relief. She avoids taking the medication after 2 PM to prevent insomnia. She reports constipation and memory loss as side effects of her medications.   She experiences worsening symptoms of depression, particularly during the winter months, and is currently prescribed Wellbutrin to manage these symptoms.    Pharmacotherapy Assessment   Oxycodone -acetaminophen  (Percocet) 5-325 mg tablet every 6 hours as needed for severe pain. MME=30 Monitoring: [place] PMP: PDMP reviewed during this encounter.       Pharmacotherapy: No side-effects or adverse reactions reported. Compliance: No problems identified. Effectiveness: Clinically acceptable.  Erlene Doyal SAUNDERS, CMA  02/21/2024  2:13 PM  Sign when Signing Visit Nursing Pain Medication Assessment:  Safety precautions to be maintained throughout the outpatient stay will  include: orient to surroundings, keep bed in low position, maintain call bell within reach at all times, provide assistance with transfer out of bed and ambulation.  Medication Inspection Compliance: Pill count conducted under aseptic conditions, in front of the patient. Neither the pills nor the bottle was removed from the patient's sight at any time. Once count was completed pills were immediately returned to the patient in their original bottle.  Medication: Oxycodone /APAP Pill/Patch Count: 66.5 of 104 pills/patches remain Pill/Patch Appearance: Markings consistent with prescribed medication Bottle Appearance: Standard pharmacy container. Clearly labeled. Filled Date: 13 / 31 / 2025 Last Medication intake:  Today    UDS:  Summary  Date Value Ref Range Status  11/15/2023 FINAL  Final    Comment:    ==================================================================== [employer] [facility] 13 (MW) ==================================================================== Test                             Result       Flag       Units  Drug Present and Declared for Prescription Verification   Oxycodone                       1610         EXPECTED   ng/mg creat   Oxymorphone                    303          EXPECTED   ng/mg creat   Noroxycodone                   3926         EXPECTED   ng/mg creat   Noroxymorphone                 203          EXPECTED   ng/mg creat    Sources of oxycodone  are scheduled prescription medications.    Oxymorphone, noroxycodone, and noroxymorphone are expected    metabolites of oxycodone . Oxymorphone is also available as a    scheduled prescription medication.  ==================================================================== Test                      Result    Flag   Units      Ref Range   Creatinine              31               mg/dL      >=79 ==================================================================== Declared Medications:  The flagging and  interpretation on this report are based on the  following declared medications.  Unexpected results may arise from  inaccuracies in the declared medications.   **Note: The testing scope of this panel includes these medications:   Oxycodone  (Percocet)   **Note: The testing scope of this panel does not include the  following reported medications:   Acetaminophen  (Percocet)  Amitriptyline  (Elavil )  Bisacodyl  Buspirone (Buspar)  Docusate  Fluticasone (Flonase)  Glimepiride (Amaryl)  Insulin (Lantus)  Levothyroxine (Synthroid)  Losartan (Cozaar)  Simvastatin (Zocor) ==================================================================== For clinical consultation, please call [phone]. ====================================================================     No results found for: CBDTHCR No results found for: D8THCCBX No results found for: D9THCCBX  ROS  Constitutional: Denies  any fever or chills Gastrointestinal: No reported hemesis, hematochezia, vomiting, or acute GI distress Musculoskeletal: Bilateral shoulder pain Neurological: No reported episodes of acute onset apraxia, aphasia, dysarthria, agnosia, amnesia, paralysis, loss of coordination, or loss of consciousness  Medication Review  DSS, amitriptyline , bisacodyl, busPIRone, fluticasone, glimepiride, insulin glargine, insulin lispro, levothyroxine, losartan, naloxone, oxyCODONE -acetaminophen , and simvastatin  History Review  Allergy: Ms. Struve is allergic to cephalexin, clarithromycin, codeine, erythromycin base, levofloxacin in d5w, other, penicillins, and sulfa antibiotics. Drug: Ms. [place]  reports no history of drug use. Alcohol:  reports that she does not currently use alcohol. Tobacco:  reports that she has never smoked. She has never used smokeless tobacco. Social: Ms. Szostak  reports that she has never smoked. She has never used smokeless tobacco. She reports that she does not currently use  alcohol. She reports that she does not use drugs. Medical:  has a past medical history of Arthritis, Diabetes mellitus without complication (HCC), Diabetic neuropathy (HCC), Fibromyalgia, Hypothyroid, Insomnia, and Thyroid  disease. Surgical: Ms. Radke  has a past surgical history that includes partial hysterectomy; Appendectomy; Tonsillectomy; Wrist surgery; and tmj implants. Family: family history is not on file. She was adopted.  Laboratory Chemistry Profile   Renal Lab Results  Component Value Date   BUN 16 05/02/2020   CREATININE 0.84 05/02/2020   GFRAA >60 06/23/2019   GFRNONAA >60 05/02/2020    Hepatic Lab Results  Component Value Date   AST 18 05/02/2020   ALT 14 05/02/2020   ALBUMIN 3.3 (L) 05/02/2020   ALKPHOS 63 05/02/2020    Electrolytes Lab Results  Component Value Date   NA 132 (L) 07/12/2021   K 3.4 (L) 05/02/2020   CL 94 (L) 05/02/2020   CALCIUM 9.0 05/02/2020    Bone No results found for: VD25OH, VD125OH2TOT, CI6874NY7, CI7874NY7, 25OHVITD1, 25OHVITD2, 25OHVITD3, TESTOFREE, TESTOSTERONE  Inflammation (CRP: Acute Phase) (ESR: Chronic Phase) No results found for: CRP, ESRSEDRATE, LATICACIDVEN       Note: Above Lab results reviewed.  Recent Imaging Review  CT Cervical Spine Wo Contrast CLINICAL DATA:  Fall from step ladder.  Hit head on counter.  EXAM: CT CERVICAL SPINE WITHOUT CONTRAST  TECHNIQUE: Multidetector CT imaging of the cervical spine was performed without intravenous contrast. Multiplanar CT image reconstructions were also generated.  RADIATION DOSE REDUCTION: This exam was performed according to the departmental dose-optimization program which includes automated exposure control, adjustment of the mA and/or kV according to patient size and/or use of iterative reconstruction technique.  COMPARISON:  MRI cervical spine 09/12/2020  FINDINGS: Alignment: Grade 1 degenerative anterolisthesis is again noted at C3-4  at C4-5. Minimal anterolisthesis is present at C5-6. Straightening of the normal cervical lordosis is present.  Skull base and vertebrae: The craniocervical junction is within normal limits. The vertebral body heights are normal. No acute fractures are present.  Soft tissues and spinal canal: No prevertebral fluid or swelling. No visible canal hematoma.  Disc levels: Uncovertebral and facet hypertrophy contribute to moderate foraminal narrowing bilaterally, right greater than left at C3-4, left greater than right at C4-5 and bilaterally at C5-6 and C6-7.  Upper chest: The lung apices are clear. The thoracic inlet is within normal limits.  IMPRESSION: 1. No acute fracture or traumatic subluxation. 2. Multilevel degenerative changes of the cervical spine as described.  Electronically Signed   By: Lonni Necessary M.D.   On: 08/13/2023 14:22 CT Head Wo Contrast CLINICAL DATA:  Fall from step ladder. Trauma to back of head. Hit head on  counter.  EXAM: CT HEAD WITHOUT CONTRAST  TECHNIQUE: Contiguous axial images were obtained from the base of the skull through the vertex without intravenous contrast.  RADIATION DOSE REDUCTION: This exam was performed according to the departmental dose-optimization program which includes automated exposure control, adjustment of the mA and/or kV according to patient size and/or use of iterative reconstruction technique.  COMPARISON:  MRI head without contrast 08/03/2021.  FINDINGS: Brain: No acute infarct, hemorrhage, or mass lesion is present. Cyst mild periventricular white matter changes are stable. The ventricles are of normal size. Deep brain nuclei are within normal limits.  The brainstem and cerebellum are within normal limits. Midline structures are within normal limits.  Vascular: Minimal calcifications are present within the cavernous internal carotid arteries bilaterally. No hyperdense vessel is present.  Skull:  Calvarium is intact. No focal lytic or blastic lesions are present. No significant extracranial soft tissue lesion is present.  Sinuses/Orbits: The paranasal sinuses and mastoid air cells are clear. Bilateral lens replacements are noted. Globes and orbits are otherwise unremarkable.  IMPRESSION: 1. No acute intracranial abnormality or significant interval change. 2. 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  Electronically Signed   By: Lonni Necessary M.D.   On: 08/13/2023 14:18 DG Hip Unilat W or Wo Pelvis 2-3 Views Right CLINICAL DATA:  Fall and right hip pain.  EXAM: DG HIP (WITH OR WITHOUT PELVIS) 2-3V RIGHT  COMPARISON:  None Available.  FINDINGS: No acute fracture or dislocation. The bones are osteopenic. Mild bilateral hip arthritic changes. The soft tissues are unremarkable.  IMPRESSION: 1. No acute fracture or dislocation. 2. Mild bilateral hip arthritic changes.  Electronically Signed   By: Vanetta Chou M.D.   On: 08/13/2023 14:13 Note: Reviewed        Physical Exam  Vitals: BP 134/72 (BP Location: Right Arm, Patient Position: Sitting, Cuff Size: Normal)   Pulse 85   Temp 97.9 F (36.6 C) (Temporal)   Resp 18   Ht 5' 1 (1.549 m)   Wt 173 lb (78.5 kg)   BMI 32.69 kg/m  BMI: Estimated body mass index is 32.69 kg/m as calculated from the following:   Height as of this encounter: 5' 1 (1.549 m).   Weight as of this encounter: 173 lb (78.5 kg). Ideal: Ideal body weight: 47.8 kg (105 lb 6.1 oz) Adjusted ideal body weight: 60.1 kg (132 lb 6.8 oz) General appearance: Well nourished, well developed, and well hydrated. In no apparent acute distress Mental status: Alert, oriented x 3 (person, place, & time)       Respiratory: No evidence of acute respiratory distress Eyes: PERLA  Musculoskeletal: Bilateral shoulder pain Upper Extremity (UE) Exam      Right  Left   Inspection    Skin color, temperature, and hair growth are WNL. No peripheral edema or cyanosis. No masses, redness, swelling, asymmetry, or associated skin lesions. No contractures.  Skin color, temperature, and hair growth are WNL. No peripheral edema or cyanosis. No masses, redness, swelling, asymmetry, or associated skin lesions. No contractures.          Functional ROM    Pain restricted ROM for shoulder  Pain restricted ROM for shoulder          Muscle Tone/Strength    Functionally intact. No obvious neuro-muscular anomalies detected.  Functionally intact. No obvious neuro-muscular anomalies detected.          Sensory (Neurological)    Musculoskeletal  pain pattern affecting the shoulder  Musculoskeletal pain pattern affecting the shoulder          Palpation    No palpable anomalies              No palpable anomalies                      Maneuver Shoulder abduction (deltoid/supraspinatus, axillary/supra scapular n,, C5) Elbow flexion (biceps brachial, musculoskeletal n, C5-6) Elbow extension (triceps, radial n, C7) Finger abduction (interossei, ulnar n, T1)    Shoulder abduction (deltoid/supraspinatus, axillary/supra scapular n,, C5) Elbow flexion (biceps brachial, musculoskeletal n, C5-6) Elbow extension (triceps, radial n, C7) Wrist extensors (C6) Finger extensors (C8) Finger abduction (interossei, ulnar n, T1)            Provocative Test    Phalen's test: deferred Tinel's test: deferred Apley's scratch test (touch opposite shoulder):  Action 1 (Across chest): Decreased ROM Action 2 (Overhead): Decreased ROM Action 3 (LB reach): Decreased ROM  Phalen's test: deferred Tinel's test: deferred Apley's scratch test (touch opposite shoulder):  Action 1 (Across chest): Decreased ROM Action 2 (Overhead): Decreased ROM Action 3 (LB reach): Decreased ROM             Level  Myotome  Dermatome  Sclerotome  ROM  C5  Elbow flexion  Lateral upper arm      C6  Wrist extension  Thumb and  index      C7  Elbow extension  Middle finger      C8  Finger extension  Ring and pinky finger      T1  Finger abduction  Medial elbow and axilla                                                                                                                                         Assessment   Diagnosis Status  1. Localized primary osteoarthritis of shoulder regions, bilateral   2. Primary osteoarthritis of right shoulder   3. Medication management   4. Chronic pain syndrome   5. Right rotator cuff tear arthropathy   6. Encounter for long-term opiate analgesic use    Having a Flare-up Having a Flare-up Controlled   Updated Problems: No problems updated.  Plan of Care  Problem-specific:  Assessment and Plan    Right shoulder pain due to primary osteoarthritis and rotator cuff tear Chronic bilateral shoulder pain (R>L) exacerbated by osteoarthritis and rotator cuff tear. Surgery not considered due to uncontrolled blood sugar and preference to maintain functionality. - Scheduled bilateral shoulder injection with Dr. Naveira.  Chronic pain syndrome managed with opioid therapy Chronic pain managed with Percocet 5-325 mg. - Continue Percocet 5-325 mg with current dosing regimen.  Patient's pain is controlled with oxycodone , continue on current medication regimen.  Prescribing drug monitoring (PDMP) reviewed, findings consistent with the use of prescribed medication and no  evidence of narcotic misuse or abuse.  Urine drug screen (UDS) up to date.  The patient was advised to drink water or use over-the-counter stool softener to prevent opioid-induced constipation.  Schedule follow-up in 90 days for medication management.  Plan: (Clinic): B/L Glenohumeral joint injection # 3 with Dr. Tanya       Ms. LASHEIKA ORTLOFF has a current medication list which includes the following long-term medication(s): amitriptyline , fluticasone, lantus solostar, insulin lispro, losartan,  glimepiride, levothyroxine, and simvastatin.  Pharmacotherapy (Medications Ordered): Meds ordered this encounter  Medications   oxyCODONE -acetaminophen  (PERCOCET/ROXICET) 5-325 MG tablet    Sig: Take 1 tablet by mouth every 6 (six) hours as needed for up to 26 days.    Dispense:  104 tablet    Refill:  0   oxyCODONE -acetaminophen  (PERCOCET/ROXICET) 5-325 MG tablet    Sig: Take 1 tablet by mouth every 6 (six) hours as needed for up to 26 days.    Dispense:  104 tablet    Refill:  0   oxyCODONE -acetaminophen  (PERCOCET/ROXICET) 5-325 MG tablet    Sig: Take 1 tablet by mouth every 6 (six) hours as needed for up to 26 days.    Dispense:  104 tablet    Refill:  0   naloxone (NARCAN) nasal spray 4 mg/0.1 mL    Sig: Place 1 spray into the nose as needed for up to 365 doses (for opioid-induced respiratory depresssion). In case of emergency (overdose), spray once into each nostril. If no response within 3 minutes, repeat application and call 911.    Dispense:  1 each    Refill:  1    Instruct patient in proper use of device.   Orders:  Orders Placed This Encounter  Procedures   SHOULDER INJECTION    Standing Status:   Future    Expiration Date:   05/23/2024    Scheduling Instructions:     Procedure: Glenohumeral Joint (shoulder) Injection # 3      Side: Bilateral     Level: Glenohumeral joint               Sedation: Patient's choice.     Timeframe: As permitted by the schedule    Where will this procedure be performed?:   [facility]        Return in about 2 weeks (around 03/06/2024) for [facility]): B/L Glenohumeral joint injection # 3 with Dr. Tanya .    Recent Visits No visits were found meeting these conditions. Showing recent visits within past 90 days and meeting all other requirements Today's Visits Date Type Provider Dept  02/21/24 Office Visit Jahmir Salo K, NP [facility]  Showing today's visits and meeting all other requirements Future  Appointments No visits were found meeting these conditions. Showing future appointments within next 90 days and meeting all other requirements  I discussed the assessment and treatment plan with the patient. The patient was provided an opportunity to ask questions and all were answered. The patient agreed with the plan and demonstrated an understanding of the instructions.  Patient advised to call back or seek an in-person evaluation if the symptoms or condition worsens.  I personally spent a total of 30 minutes in the care of the patient today including preparing to see the patient, getting/reviewing separately obtained history, performing a medically appropriate exam/evaluation, counseling and educating, placing orders, referring and communicating with other health care professionals, documenting clinical information in the EHR, independently interpreting results, communicating results, and coordinating care.  Note by: Jasman Murri K Melford Tullier, NP (TTS and AI technology used. I apologize for any typographical errors that were not detected and corrected.) Date: 02/21/2024; Time: 2:43 PM

## 2024-02-21 NOTE — Patient Instructions (Signed)
 Trigger Point Injections Patient Information  Description: Trigger points are areas of muscle sensitive to touch which cause pain with movement, sometimes felt some distance from the site of palpation.  Usually the muscle containing these trigger points if felt as a tight band or knot.   The area of maximum tenderness or trigger point is identified, and after antiseptic preparation of the skin, a small needle is placed into this site.  Reproduction of the pain often occurs and numbing medicine (local anesthetic) is injected into the site, sometimes along with steroid preparation.  The entire block usually lasts less than 5 minutes.  Conditions which may be treated by trigger points:  Muscular pain and spasm Nerve irritation  Preparation for the injection:  Do not eat any solid food or dairy products within 8 hours of your appointment. You may drink clear liquids up to 3 hours before appointment.  Clear liquids include water, black coffee, juice or soda.  No milk or cream please. You may take your regular medications, including pain medications, with a sip of water before your appointment.  Diabetics should hold regular insulin ( if take separately) and take 1/2 normal NPH dose the morning of the procedure.  Carry some sugar containing items with you to your appointment. A driver must accompany you and be prepared to drive you home after your procedure.  Bring all your current medications with you. An IV may be inserted and sedation may be given at the discretion of the physician.  A blood pressure cuff, EKG, and other monitors will often be applied during the procedure.  Some patients may need to have extra oxygen administered for a short period. You will be asked to provide medical information, including your allergies and medications, prior to the procedure.  We must know immediately if you are taking blood thinners (like Coumadin/Warfarin) or if you are allergic to IV iodine contrast (dye).   We must know if you could possibly be pregnant.  Possible side-effects:  Bleeding from needle site Infection (rare, may require surgery) Nerve injury (rare) Numbness & tingling (temporary) Punctured lung (if injection around chest) Light-headedness (temporary) Pain at injection site (several days) Decreased blood pressure (rare, temporary) Weakness in arm/leg (temporary)  Call if you experience:  Hive or difficulty breathing (go to the emergency room) Inflammation or drainage at the injection site(s)  Please note:  Although the local anesthetic injected can often make your painful muscle feel good for several hours after the injection, the pain may return.  It takes 3-7 days for steroids to work.  You may not notice any pain relief for at least one week.  If effective, we will often do a series of injections spaced 3-6 weeks apart to maximally decrease your pain.  If you have any questions please call [phone] [facility] Spr[facility] What is this medication? NALOXONE (nal OX one) treats opioid overdose, which causes slow or shallow breathing, severe drowsiness, or trouble staying awake. Call emergency services after using this medication. You may need additional treatment. Naloxone works by reversing the effects of opioids. It belongs to a group of medications called opioid blockers. This medicine may be used for other purposes; ask your health care provider or pharmacist if you have questions. COMMON BRAND NAME(S): Kloxxado, Narcan, REXTOVY, RiVive What should I tell my care team before I take this medication? They need to know if you have any of these conditions: Heart disease Substance use disorder An unusual or  allergic reaction to naloxone, other medications, foods, dyes, or preservatives Pregnant or trying to get pregnant Breast-feeding How should I use this medication? This medication is for use in the nose. Lay the person  on their back. Support their neck with your hand and allow the head to tilt back before giving the medication. The nasal spray should be given into 1 nostril. After giving the medication, move the person onto their side. Do not remove or test the nasal spray until ready to use. Get emergency medical help right away after giving the first dose of this medication, even if the person wakes up. You should be familiar with how to recognize the signs and symptoms of a narcotic overdose. If more doses are needed, give the additional dose in the other nostril. Talk to your care team about the use of this medication in children. While this medication may be prescribed for children as young as newborns for selected conditions, precautions do apply. Overdosage: If you think you have taken too much of this medicine contact a poison control center or emergency room at once. NOTE: This medicine is only for you. Do not share this medicine with others. What if I miss a dose? This does not apply. What may interact with this medication? This is only used during an emergency. No interactions are expected during emergency use. This list may not describe all possible interactions. Give your health care provider a list of all the medicines, herbs, non-prescription drugs, or dietary supplements you use. Also tell them if you smoke, drink alcohol, or use illegal drugs. Some items may interact with your medicine. What should I watch for while using this medication? Keep this medication ready for use in the case of an opioid overdose. Make sure that you have the phone number of your care team and local hospital ready. You may need to have additional doses of this medication. Each nasal spray contains a single dose. Some emergencies may require additional doses. After use, bring the treated person to the nearest hospital or call 911. Make sure the treating care team knows that the person has received a dose of this medication. You  will receive additional instructions on what to do during and after use of this medication before an emergency occurs. What side effects may I notice from receiving this medication? Side effects that you should report to your care team as soon as possible: Allergic reactions--skin rash, itching, hives, swelling of the face, lips, tongue, or throat Side effects that usually do not require medical attention (report these to your care team if they continue or are bothersome): Constipation Dryness or irritation inside the nose Headache Increase in blood pressure Muscle spasms Stuffy nose Toothache This list may not describe all possible side effects. Call your doctor for medical advice about side effects. You may report side effects to FDA at 1-800-FDA-1088. Where should I keep my medication? Keep out of the reach of children and pets. Store between 20 and 25 degrees C (68 and 77 degrees F). Do not freeze. Throw away any unused medication after the expiration date. Keep in original box until ready to use. NOTE: This sheet is a summary. It may not cover all possible information. If you have questions about this medicine, talk to your doctor, pharmacist, or health care provider.  2024 Elsevier/Gold Standard (2021-02-22 00:00:00)

## 2024-02-21 NOTE — Progress Notes (Signed)
 Nursing Pain Medication Assessment:  Safety precautions to be maintained throughout the outpatient stay will include: orient to surroundings, keep bed in low position, maintain call bell within reach at all times, provide assistance with transfer out of bed and ambulation.  Medication Inspection Compliance: Pill count conducted under aseptic conditions, in front of the patient. Neither the pills nor the bottle was removed from the patient's sight at any time. Once count was completed pills were immediately returned to the patient in their original bottle.  Medication: Oxycodone /APAP Pill/Patch Count: 66.5 of 104 pills/patches remain Pill/Patch Appearance: Markings consistent with prescribed medication Bottle Appearance: Standard pharmacy container. Clearly labeled. Filled Date: 43 / 31 / 2025 Last Medication intake:  Today

## 2024-03-05 ENCOUNTER — Ambulatory Visit (HOSPITAL_BASED_OUTPATIENT_CLINIC_OR_DEPARTMENT_OTHER): Admitting: Pain Medicine

## 2024-03-05 DIAGNOSIS — Z5189 Encounter for other specified aftercare: Secondary | ICD-10-CM

## 2024-03-05 DIAGNOSIS — G8929 Other chronic pain: Secondary | ICD-10-CM

## 2024-03-05 DIAGNOSIS — Z91199 Patient's noncompliance with other medical treatment and regimen due to unspecified reason: Secondary | ICD-10-CM

## 2024-03-05 NOTE — Patient Instructions (Signed)

## 2024-03-05 NOTE — Progress Notes (Signed)
 Department: [facility]) Date: 03/05/2024  Event: NO SHOW.  Encounter Type: Procedure.          Advance notice: None.  Reason: Unknown.          Significance: Unintended waste of community medical resources. See patient information (AVS) for Appointment policy.

## 2024-03-06 ENCOUNTER — Telehealth: Payer: Self-pay | Admitting: *Deleted

## 2024-03-06 NOTE — Telephone Encounter (Signed)
 Post procedure call;  patient did not have procedure on yesterday, she thought her appt was for today at 1300.  She elects to call back to reschedule appt with FN.

## 2024-03-20 NOTE — Patient Instructions (Signed)
 ______________________________________________________________________    Post-Procedure Discharge Instructions  INSTRUCTIONS Apply ice:  Purpose: This will minimize any swelling and discomfort after procedure.  When: Day of procedure, as soon as you get home. How: Fill a plastic sandwich bag with crushed ice. Cover it with a small towel and apply to injection site. How long: (15 min on, 15 min off) Apply for 15 minutes then remove x 15 minutes.  Repeat sequence on day of procedure, until you go to bed. Apply heat:  Purpose: To treat any soreness and discomfort from the procedure. When: Starting the next day after the procedure. How: Apply heat to procedure site starting the day following the procedure. How long: May continue to repeat daily, until discomfort goes away. Food intake: Start with clear liquids (like water) and advance to regular food, as tolerated.  Physical activities: Keep activities to a minimum for the first 8 hours after the procedure. After that, then as tolerated. Driving: If you have received any sedation, be responsible and do not drive. You are not allowed to drive for 24 hours after having sedation. Blood thinner: (Applies only to those taking blood thinners) You may restart your blood thinner 6 hours after your procedure. Insulin: (Applies only to Diabetic patients taking insulin) As soon as you can eat, you may resume your normal dosing schedule. Infection prevention: Keep procedure site clean and dry. Shower daily and clean area with soap and water.  PAIN DIARY Post-procedure Pain Diary: Extremely important that this be done correctly and accurately. Recorded information will be used to determine the next step in treatment. For the purpose of accuracy, follow these rules: Evaluate only the area treated. Do not report or include pain from an untreated area. For the purpose of this evaluation, ignore all other areas of pain, except for the treated area. After your  procedure, avoid taking a long nap and attempting to complete the pain diary after you wake up. Instead, set your alarm clock to go off every hour, on the hour, for the initial 8 hours after the procedure. Document the duration of the numbing medicine, and the relief you are getting from it. Do not go to sleep and attempt to complete it later. It will not be accurate. If you received sedation, it is likely that you were given a medication that may cause amnesia. Because of this, completing the diary at a later time may cause the information to be inaccurate. This information is needed to plan your care. Follow-up appointment: Keep your post-procedure follow-up evaluation appointment after the procedure (usually 2 weeks for most procedures, 6 weeks for radiofrequencies). DO NOT FORGET to bring you pain diary with you.   EXPECT... (What should I expect to see with my procedure?) From numbing medicine (AKA: Local Anesthetics): Numbness or decrease in pain. You may also experience some weakness, which if present, could last for the duration of the local anesthetic. Onset: Full effect within 15 minutes of injected. Duration: It will depend on the type of local anesthetic used. On the average, 1 to 8 hours.  From steroids (Applies only if steroids were used): Decrease in swelling or inflammation. Once inflammation is improved, relief of the pain will follow. Onset of benefits: Depends on the amount of swelling present. The more swelling, the longer it will take for the benefits to be seen. In some cases, up to 10 days. Duration: Steroids will stay in the system x 2 weeks. Duration of benefits will depend on multiple posibilities including persistent irritating  factors. Side-effects: If present, they may typically last 2 weeks (the duration of the steroids). Frequent: Cramps (if they occur, drink Gatorade and take over-the-counter Magnesium 450-500 mg once to twice a day); water retention with temporary weight  gain; increases in blood sugar; decreased immune system response; increased appetite. Occasional: Facial flushing (red, warm cheeks); mood swings; menstrual changes. Uncommon: Long-term decrease or suppression of natural hormones; bone thinning. (These are more common with higher doses or more frequent use. This is why we prefer that our patients avoid having any injection therapies in other practices.)  Very Rare: Severe mood changes; psychosis; aseptic necrosis. From procedure: Some discomfort is to be expected once the numbing medicine wears off. This should be minimal if ice and heat are applied as instructed.  CALL IF... (When should I call?) You experience numbness and weakness that gets worse with time, as opposed to wearing off. New onset bowel or bladder incontinence. (Applies only to procedures done in the spine)  Emergency Numbers: Durning business hours (Monday - Thursday, 8:00 AM - 4:00 PM) (Friday, 9:00 AM - 12:00 Noon): [phone] After hours: [phone] NOTE: If you are having a problem and are unable connect with, or to talk to a provider, then go to your nearest urgent care or emergency department. If the problem is serious and urgent, please call 911. ______________________________________________________________________     ______________________________________________________________________    Steroid injections  Common steroids for injections Triamcinolone : Used by many sports medicine physicians for large joint and bursal injections, often combined with a local anesthetic like lidocaine . A study focusing on coccydynia (tailbone pain) found triamcinolone  was more effective than betamethasone, suggesting it may also be preferable for other localized inflammation conditions. Methylprednisolone: A common alternative to triamcinolone  that is also a strong anti-inflammatory. It is available in different formulations, with the acetate suspension being the long-acting  option for intra-articular injections. Dexamethasone : This is a non-particulate steroid, meaning it has a lower risk of tissue damage compared to particulate steroids like triamcinolone  and methylprednisolone. While less common for this specific use, it is an option for targeted injections.   Considerations for physicians Particulate vs. non-particulate steroids: Triamcinolone  and methylprednisolone are particulate, meaning they can clump together. Dexamethasone  is non-particulate. Particulate steroids are often preferred for their longer-lasting effects but carry a theoretical higher risk for certain injections (though this is less of a concern in the costochondral joints). Combined injectate: Corticosteroids are typically mixed with a local anesthetic like lidocaine  to provide both immediate pain relief (from the anesthetic) and longer-term inflammation reduction (from the steroid). Imaging guidance: To ensure accurate placement of the needle and medication, physicians may use ultrasound or fluoroscopic guidance for the injection, especially in complex or refractory cases.   Patient guidance Before undergoing a steroid injection, discuss the options with your physician. They will determine the best steroid, dosage, and procedure for your specific case based on factors like: Severity of your condition History of response to other treatments Your overall health status Experience and preference of the physician  Last  Updated: 12/05/2023 ______________________________________________________________________

## 2024-03-20 NOTE — Progress Notes (Addendum)
 PROVIDER NOTE: Interpretation of information contained herein should be left to medically-trained personnel. Specific patient instructions are provided elsewhere under Patient Instructions section of medical record. This document was created in part using STT-dictation technology, any transcriptional errors that may result from this process are unintentional.  Patient: Ruth Stout Type: Established DOB: 09/14/1945 MRN: [id number] PCP: Sherial Bail, MD  Service: Procedure DOS: 03/21/2024 Setting: Ambulatory Location: Ambulatory outpatient facility Delivery: Face-to-face Provider: Eric DELENA Como, MD Specialty: Interventional Pain Management Specialty designation: 09 Location: Outpatient facility Ref. Prov.: Sherial Bail, MD       Interventional Therapy   Procedure: Glenohumeral and acromioclavicular joint Injection No.:[id number]  Laterality: Bilateral (-50)  Level: Shoulder  Target: Glenohumeral and acromioclavicular joint Location: Intra-articular  Region: Entire Shoulder Area Approach: Anterolateral approach. Type of procedure: Percutaneous joint injection   Imaging: Fluoroscopy-guided Non-spinal ([id number]) Anesthesia: Local anesthesia (1-2% Lidocaine ) Anxiolysis: None Declined                    Sedation: No Sedation                       DOS: 03/21/2024  Performed by: Eric DELENA Como, MD  Purpose: Diagnostic/Therapeutic Indications: Shoulder pain severe enough to impact quality of life or function. Rationale (medical necessity): procedure needed and proper for the diagnosis and/or treatment of Ms. Born's medical symptoms and needs. 1. Chronic shoulder pain (Bilateral)   2. Osteoarthritis of shoulders (Bilateral)   3. Osteoarthritis of acromioclavicular joints (Bilateral)   4. Acromioclavicular joint pain (Bilateral)   5. Rotator cuff tear arthropathy (Right)   6. History of anterior dislocation of shoulder (Right) (01/28/2021)    NAS-11 Pain score:    Pre-procedure: 9 /10   Post-procedure: 1 /10       Position  Prep  Materials:  Position: Supine Prep solution: ChloraPrep (2% chlorhexidine gluconate and 70% isopropyl alcohol) Prep Area: Entire shoulder Area Materials:  Tray: Block Needle(s):  Type: Spinal  Gauge (G): 22  Length: 3.5-in  Qty: 2  H&P (Pre-op Assessment):  Ruth Stout is a 78 y.o. (year old), female patient, seen today for interventional treatment. She  has a past surgical history that includes partial hysterectomy; Appendectomy; Tonsillectomy; Wrist surgery; and tmj implants. Ruth Stout has a current medication list which includes the following prescription(s): amitriptyline , bisacodyl, buspirone, dss, fluticasone, lantus solostar, insulin lispro, losartan, naloxone , oxycodone -acetaminophen , [START ON 04/01/2024] oxycodone -acetaminophen , and [START ON 04/27/2024] oxycodone -acetaminophen , and the following Facility-Administered Medications: pentafluoroprop-tetrafluoroeth. Her primarily concern today is the Shoulder Pain  Initial Vital Signs:  Pulse/HCG Rate: 94ECG Heart Rate: 79 Temp: 98.1 F (36.7 C) Resp: 16 BP: (!) 144/72 SpO2: 99 %  BMI: Estimated body mass index is 31.64 kg/m as calculated from the following:   Height as of this encounter: 5' 2 (1.575 m).   Weight as of this encounter: 173 lb (78.5 kg).  Risk Assessment: Allergies: Reviewed. She is allergic to cephalexin, clarithromycin, codeine, erythromycin base, levofloxacin in d5w, other, penicillins, and sulfa antibiotics.  Allergy Precautions: None required Coagulopathies: Reviewed. None identified.  Blood-thinner therapy: None at this time Active Infection(s): Reviewed. None identified. Ruth Stout is afebrile  Site Confirmation: Ruth Stout was asked to confirm the procedure and laterality before marking the site Procedure checklist: Completed Consent: Before the procedure and under the influence of no sedative(s), amnesic(s), or  anxiolytics, the patient was informed of the treatment options, risks and possible complications. To fulfill our ethical and legal obligations, as recommended by  the American Medical Association's Code of Ethics, I have informed the patient of my clinical impression; the nature and purpose of the treatment or procedure; the risks, benefits, and possible complications of the intervention; the alternatives, including doing nothing; the risk(s) and benefit(s) of the alternative treatment(s) or procedure(s); and the risk(s) and benefit(s) of doing nothing. The patient was provided information about the general risks and possible complications associated with the procedure. These may include, but are not limited to: failure to achieve desired goals, infection, bleeding, organ or nerve damage, allergic reactions, paralysis, and death. In addition, the patient was informed of those risks and complications associated to the procedure, such as failure to decrease pain; infection; bleeding; organ or nerve damage with subsequent damage to sensory, motor, and/or autonomic systems, resulting in permanent pain, numbness, and/or weakness of one or several areas of the body; allergic reactions; (i.e.: anaphylactic reaction); and/or death. Furthermore, the patient was informed of those risks and complications associated with the medications. These include, but are not limited to: allergic reactions (i.e.: anaphylactic or anaphylactoid reaction(s)); adrenal axis suppression; blood sugar elevation that in diabetics may result in ketoacidosis or comma; water retention that in patients with history of congestive heart failure may result in shortness of breath, pulmonary edema, and decompensation with resultant heart failure; weight gain; swelling or edema; medication-induced neural toxicity; particulate matter embolism and blood vessel occlusion with resultant organ, and/or nervous system infarction; and/or aseptic necrosis of one or  more joints. Finally, the patient was informed that Medicine is not an exact science; therefore, there is also the possibility of unforeseen or unpredictable risks and/or possible complications that may result in a catastrophic outcome. The patient indicated having understood very clearly. We have given the patient no guarantees and we have made no promises. Enough time was given to the patient to ask questions, all of which were answered to the patient's satisfaction. Ms. Rudge has indicated that she wanted to continue with the procedure. Attestation: I, the ordering provider, attest that I have discussed with the patient the benefits, risks, side-effects, alternatives, likelihood of achieving goals, and potential problems during recovery for the procedure that I have provided informed consent. Date  Time: 03/21/2024 11:04 AM   Imaging Guidance (Non-Spinal):          Type of Imaging Technique: Fluoroscopy Guidance (Non-Spinal) Indication(s): Fluoroscopy guidance for needle placement to enhance accuracy in procedures requiring precise needle localization for targeted delivery of medication in or near specific anatomical locations not easily accessible without such real-time imaging assistance. Exposure Time: Please see nurses notes. Contrast: None used. Fluoroscopic Guidance: I was personally present during the use of fluoroscopy. Tunnel Vision Technique used to obtain the best possible view of the target area. Parallax error corrected before commencing the procedure. Direction-depth-direction technique used to introduce the needle under continuous pulsed fluoroscopy. Once target was reached, antero-posterior, oblique, and lateral fluoroscopic projection used confirm needle placement in all planes. Images permanently stored in EMR. Interpretation: No contrast injected. I personally interpreted the imaging intraoperatively. Adequate needle placement confirmed in multiple planes. Permanent images  saved into the patient's record.  Pre-Procedure Preparation:  Monitoring: As per clinic protocol. Respiration, ETCO2, SpO2, BP, heart rate and rhythm monitor placed and checked for adequate function Safety Precautions: Patient was assessed for positional comfort and pressure points before starting the procedure. Time-out: I initiated and conducted the Time-out before starting the procedure, as per protocol. The patient was asked to participate by confirming the accuracy of the Time  Out information. Verification of the correct person, site, and procedure were performed and confirmed by me, the nursing staff, and the patient. Time-out conducted as per Joint Commission's Universal Protocol (UP.01.01.01). Time: 1217 Start Time: 1217 hrs.  Description  Narrative of Procedure:          Rationale (medical necessity): procedure needed and proper for the diagnosis and/or treatment of the patient's medical symptoms and needs. Procedural Technique Safety Precautions: Aspiration looking for blood return was conducted prior to all injections. At no point did we inject any substances, as a needle was being advanced. No attempts were made at seeking any paresthesias. Safe injection practices and needle disposal techniques used. Medications properly checked for expiration dates. SDV (single dose vial) medications used. Description of the Procedure: Protocol guidelines were followed. The patient was placed in position over the procedure table. The target area was identified and the area prepped in the usual manner. Skin & deeper tissues infiltrated with local anesthetic. Appropriate amount of time allowed to pass for local anesthetics to take effect. The procedure needles were then advanced to the target area. Proper needle placement secured. Negative aspiration confirmed. Solution injected in intermittent fashion, asking for systemic symptoms every 0.5cc of injectate. The needles were then removed and the area  cleansed, making sure to leave some of the prepping solution back to take advantage of its long term bactericidal properties.             Vitals:   03/21/24 1217 03/21/24 1221 03/21/24 1226 03/21/24 1228  BP: 131/69 136/67 136/67 (!) 144/72  Pulse:      Resp: 10 18 14 13   Temp:      SpO2: 98% 96% 97% 98%  Weight:      Height:         Start Time: 1217 hrs. End Time: 1228 hrs.  Antibiotic Prophylaxis:   Anti-infectives (From admission, onward)    None      Indication(s): None identified  Post-operative Assessment:  Post-procedure Vital Signs:  Pulse/HCG Rate: 9477 Temp: 98.1 F (36.7 C) Resp: 13 BP: (!) 144/72 SpO2: 98 %  EBL: None  Complications: No immediate post-treatment complications observed by team, or reported by patient.  Note: The patient tolerated the entire procedure well. A repeat set of vitals were taken after the procedure and the patient was kept under observation following institutional policy, for this type of procedure. Post-procedural neurological assessment was performed, showing return to baseline, prior to discharge. The patient was provided with post-procedure discharge instructions, including a section on how to identify potential problems. Should any problems arise concerning this procedure, the patient was given instructions to immediately contact us , at any time, without hesitation. In any case, we plan to contact the patient by telephone for a follow-up status report regarding this interventional procedure.  Comments:  No additional relevant information.  Plan of Care (POC)  Orders:  Orders Placed This Encounter  Procedures   SHOULDER INJECTION    Scheduling Instructions:     Procedure: Intra-articular shoulder (Glenohumeral) joint and (AC) Acromioclavicular joint injection     Side: Bilateral     Level: Glenohumeral joint and (AC) Acromioclavicular joint     Sedation: Patient's choice.     Date: 03/21/2024    Where will this  procedure be performed?:   [facility]   [facility] C-ARM 1-60 MIN NO REPORT    Intraoperative interpretation by procedural physician at [facility].    Standing Status:  Standing    Number of Occurrences:   1    Reason for exam::   Assistance in needle guidance and placement for procedures requiring needle placement in or near specific anatomical locations not easily accessible without such assistance.   Informed Consent Details: Physician/Practitioner Attestation; Transcribe to consent form and obtain patient signature    Note: Always confirm laterality of pain with Ms. Hanback, before procedure.    Physician/Practitioner attestation of informed consent for procedure/surgical case:   I, the physician/practitioner, attest that I have discussed with the patient the benefits, risks, side effects, alternatives, likelihood of achieving goals and potential problems during recovery for the procedure that I have provided informed consent.    Procedure:   Intra-articular shoulder joint injection under fluoroscopic guidance    Physician/Practitioner performing the procedure:   Brexlee Heberlein A. Tanya, MD    Indication/Reason:   Chronic shoulder pain secondary to shoulder arthropathy   Provide equipment / supplies at bedside    Procedure tray: Block Tray (Disposable  single use) Skin infiltration needle: Regular 1.5-in, 25-G, (x1) Block Needle type: Spinal Amount/quantity: 2 Size: Regular (3.5-inch) Gauge: 22G    Standing Status:   Standing    Number of Occurrences:   1    Specify:   Block Tray    Opioid Analgesic: oxycodone /APAP 5/325, 1 tab p.o. every 6 hours  MME/day: 30 mg/day   Medications ordered for procedure: Meds ordered this encounter  Medications   lidocaine  (XYLOCAINE ) 2 % (with pres) injection 400 mg   pentafluoroprop-tetrafluoroeth (GEBAUERS) aerosol   DISCONTD: midazolam (VERSED) 5 MG/5ML injection 0.5-2 mg    Make sure Flumazenil is available in the  pyxis when using this medication. If oversedation occurs, administer 0.2 mg IV over 15 sec. If after 45 sec no response, administer 0.2 mg again over 1 min; may repeat at 1 min intervals; not to exceed 4 doses (1 mg)   DISCONTD: fentaNYL  (SUBLIMAZE ) injection 25-50 mcg    Make sure Narcan  is available in the pyxis when using this medication. In the event of respiratory depression (RR< 8/min): Titrate NARCAN  (naloxone ) in increments of 0.1 to 0.2 mg IV at 2-3 minute intervals, until desired degree of reversal.   methylPREDNISolone  acetate (DEPO-MEDROL ) injection 40 mg   ropivacaine  (PF) 2 mg/mL (0.2%) (NAROPIN ) injection 9 mL   methylPREDNISolone  acetate (DEPO-MEDROL ) injection 40 mg   ropivacaine  (PF) 2 mg/mL (0.2%) (NAROPIN ) injection 9 mL   Medications administered: We administered lidocaine , methylPREDNISolone  acetate, ropivacaine  (PF) 2 mg/mL (0.2%), methylPREDNISolone  acetate, and ropivacaine  (PF) 2 mg/mL (0.2%).  See the medical record for exact dosing, route, and time of administration.    Interventional Therapies  Risk Factors  Considerations  Medical Comorbidities:  Allergy: Codeine, PCN, other Abx  T2IDDM  GAD  ADD  HTN  GERD     Planned  Pending:   Therapeutic bilateral shoulder inj. No.:[id number] (acromioclavicular #1)    Under consideration:      Completed: (Analgesic benefit)1  Therapeutic right shoulder inj. x3 (05/10/2023) (100/100/75/50)  Therapeutic left shoulder inj. x2 (05/10/2023) (100/100/75/50)  Therapeutic bilateral LE Qutenza  TX x2 (11/16/2022)    Therapeutic  Palliative (PRN) options:   None established   Completed by other providers:   None reported  1(Analgesic benefit): Expressed in percentage (%). (Local anesthetic[LA] +/- sedation  L.A.Local Anesthetic  Steroid benefit  Ongoing benefit)    Follow-up plan:   Return in about 2 weeks (around 04/04/2024) for (Face2F), (PPE), w/ Dr. Marcelino.     Recent Visits  Date Type Provider Dept   03/05/24 Procedure visit Tanya Glisson, MD [facility]  02/21/24 Office Visit Patel, Seema K, NP [facility]  Showing recent visits within past 90 days and meeting all other requirements Today's Visits Date Type Provider Dept  03/21/24 Procedure visit Tanya Glisson, MD [facility]  Showing today's visits and meeting all other requirements Future Appointments Date Type Provider Dept  04/16/24 Appointment Marcelino Nurse, MD [facility]  05/15/24 Appointment Patel, Seema K, NP [facility]  Showing future appointments within next 90 days and meeting all other requirements   Disposition: Discharge home  Discharge (Date  Time): 03/21/2024; 1235 hrs.   Primary Care Physician: Sherial Bail, MD Location: [facility] Note by: Glisson DELENA Tanya, MD (TTS technology used. I apologize for any typographical errors that were not detected and corrected.) Date: 03/21/2024; Time: 1:29 PM  Disclaimer:  Medicine is not an visual merchandiser. The only guarantee in medicine is that nothing is guaranteed. It is important to note that the decision to proceed with this intervention was based on the information collected from the patient. The Data and conclusions were drawn from the patient's questionnaire, the interview, and the physical examination. Because the information was provided in large part by the patient, it cannot be guaranteed that it has not been purposely or unconsciously manipulated. Every effort has been made to obtain as much relevant data as possible for this evaluation. It is important to note that the conclusions that lead to this procedure are derived in large part from the available data. Always take into account that the treatment will also be dependent on availability of resources and existing treatment guidelines, considered by other Pain Management Practitioners as being common knowledge and  practice, at the time of the intervention. For Medico-Legal purposes, it is also important to point out that variation in procedural techniques and pharmacological choices are the acceptable norm. The indications, contraindications, technique, and results of the above procedure should only be interpreted and judged by a Board-Certified Interventional Pain Specialist with extensive familiarity and expertise in the same exact procedure and technique.

## 2024-03-21 ENCOUNTER — Encounter: Payer: Self-pay | Admitting: Pain Medicine

## 2024-03-21 ENCOUNTER — Ambulatory Visit
Admission: RE | Admit: 2024-03-21 | Discharge: 2024-03-21 | Disposition: A | Discharge status: Home / Self Care | Source: Ambulatory Visit | Attending: Pain Medicine | Admitting: Pain Medicine

## 2024-03-21 ENCOUNTER — Ambulatory Visit: Admitting: Pain Medicine

## 2024-03-21 VITALS — BP 144/72 | HR 94 | Temp 98.1°F | Resp 13 | Ht 62.0 in | Wt 173.0 lb

## 2024-03-21 DIAGNOSIS — S43004S Unspecified dislocation of right shoulder joint, sequela: Secondary | ICD-10-CM | POA: Insufficient documentation

## 2024-03-21 DIAGNOSIS — M19011 Primary osteoarthritis, right shoulder: Secondary | ICD-10-CM | POA: Insufficient documentation

## 2024-03-21 DIAGNOSIS — M25511 Pain in right shoulder: Secondary | ICD-10-CM | POA: Diagnosis present

## 2024-03-21 DIAGNOSIS — M12811 Other specific arthropathies, not elsewhere classified, right shoulder: Secondary | ICD-10-CM | POA: Diagnosis present

## 2024-03-21 DIAGNOSIS — M19012 Primary osteoarthritis, left shoulder: Secondary | ICD-10-CM | POA: Diagnosis present

## 2024-03-21 DIAGNOSIS — M25512 Pain in left shoulder: Secondary | ICD-10-CM

## 2024-03-21 DIAGNOSIS — M75101 Unspecified rotator cuff tear or rupture of right shoulder, not specified as traumatic: Secondary | ICD-10-CM | POA: Diagnosis present

## 2024-03-21 DIAGNOSIS — G8929 Other chronic pain: Secondary | ICD-10-CM | POA: Insufficient documentation

## 2024-03-21 MED ORDER — MIDAZOLAM HCL 5 MG/5ML IJ SOLN: 0.5000 mg | Freq: Once | INTRAMUSCULAR | Status: DC

## 2024-03-21 MED ORDER — FENTANYL CITRATE (PF) 100 MCG/2ML IJ SOLN: 25.0000 ug | INTRAMUSCULAR | Status: DC | PRN

## 2024-03-21 MED ADMIN — Ropivacaine HCl Inj 2 MG/ML: 9 mL | PERINEURAL | NDC 43066001501

## 2024-03-21 MED ADMIN — Methylprednisolone Acetate Inj Susp 40 MG/ML: 40 mg | INTRA_ARTICULAR | NDC 70121157301

## 2024-03-21 MED ADMIN — Ropivacaine HCl Inj 2 MG/ML: 9 mL | INTRA_ARTICULAR | NDC 43066001501

## 2024-03-21 MED ADMIN — Lidocaine HCl Local Inj 2%: 400 mg | NDC 00409427716

## 2024-03-21 MED FILL — Ropivacaine HCl Inj 2 MG/ML: 9.0000 mL | INTRAMUSCULAR | Qty: 20 | Status: AC

## 2024-03-21 MED FILL — Lidocaine HCl Local Inj 2%: 20.0000 mL | INTRAMUSCULAR | Qty: 40 | Status: AC

## 2024-03-21 MED FILL — Methylprednisolone Acetate Inj Susp 40 MG/ML: 40.0000 mg | INTRAMUSCULAR | Qty: 1 | Status: AC

## 2024-03-21 NOTE — Progress Notes (Signed)
 Safety precautions to be maintained throughout the outpatient stay will include: orient to surroundings, keep bed in low position, maintain call bell within reach at all times, provide assistance with transfer out of bed and ambulation.   Patient dose not want sedation. She states that she does not have any veins and it would hurt more than having injections.

## 2024-03-22 ENCOUNTER — Telehealth: Payer: Self-pay

## 2024-03-22 NOTE — Telephone Encounter (Signed)
 Patient returned post procedure call, states that her pain is much better 2/10. No other questions or concerns today.

## 2024-03-22 NOTE — Telephone Encounter (Signed)
Post procedure call, left voicemail 

## 2024-04-16 ENCOUNTER — Ambulatory Visit: Admitting: Student in an Organized Health Care Education/Training Program

## 2024-05-02 ENCOUNTER — Ambulatory Visit: Admitting: Student in an Organized Health Care Education/Training Program

## 2024-05-02 ENCOUNTER — Encounter: Payer: Self-pay | Admitting: Student in an Organized Health Care Education/Training Program

## 2024-05-02 VITALS — BP 158/89 | HR 89 | Temp 97.2°F | Ht 62.0 in | Wt 173.0 lb

## 2024-05-02 DIAGNOSIS — M19012 Primary osteoarthritis, left shoulder: Secondary | ICD-10-CM | POA: Insufficient documentation

## 2024-05-02 DIAGNOSIS — M25512 Pain in left shoulder: Secondary | ICD-10-CM | POA: Diagnosis present

## 2024-05-02 DIAGNOSIS — M75101 Unspecified rotator cuff tear or rupture of right shoulder, not specified as traumatic: Secondary | ICD-10-CM | POA: Diagnosis present

## 2024-05-02 DIAGNOSIS — Z8739 Personal history of other diseases of the musculoskeletal system and connective tissue: Secondary | ICD-10-CM

## 2024-05-02 DIAGNOSIS — M25511 Pain in right shoulder: Secondary | ICD-10-CM | POA: Insufficient documentation

## 2024-05-02 DIAGNOSIS — M19011 Primary osteoarthritis, right shoulder: Secondary | ICD-10-CM | POA: Diagnosis present

## 2024-05-02 DIAGNOSIS — G8929 Other chronic pain: Secondary | ICD-10-CM | POA: Insufficient documentation

## 2024-05-02 DIAGNOSIS — M12811 Other specific arthropathies, not elsewhere classified, right shoulder: Secondary | ICD-10-CM | POA: Diagnosis present

## 2024-05-02 DIAGNOSIS — S43004S Unspecified dislocation of right shoulder joint, sequela: Secondary | ICD-10-CM | POA: Insufficient documentation

## 2024-05-02 NOTE — Progress Notes (Signed)
 PROVIDER NOTE: Interpretation of information contained herein should be left to medically-trained personnel. Specific patient instructions are provided elsewhere under Patient Instructions section of medical record. This document was created in part using AI and STT-dictation technology, any transcriptional errors that may result from this process are unintentional.  Patient: Ruth Stout  Service: E/M   PCP: Sherial Bail, MD  DOB: 1945/09/20  DOS: 05/02/2024  Provider: Wallie Sherry, MD  MRN: [id number]  Delivery: Face-to-face  Specialty: Interventional Pain Management  Type: Established Patient  Setting: Ambulatory outpatient facility  Specialty designation: 09  Referring Prov.: Sherial Bail, MD  Location: Outpatient office facility       History of present illness (HPI) Ruth Stout, a 79 y.o. year old female, is here today because of her Chronic pain of both shoulders [M25.511, G89.29, M25.512]. Ms. Screws primary complain today is Shoulder Pain (Right and left)  Pain Assessment: Severity of Chronic pain is reported as a 7 /10. Location: Shoulder Right, Left/pain radiaties down her arm to her , right hand and thumb. Onset: More than a month ago. Quality: Aching, Burning, Shooting, Stabbing, Pressure. Timing: Constant. Modifying factor(s): Meds, ice. Vitals:  height is 5' 2 (1.575 m) and weight is 173 lb (78.5 kg). Her temperature is 97.2 F (36.2 C) (abnormal). Her blood pressure is 158/89 (abnormal) and her pulse is 89. Her oxygen saturation is 100%.  BMI: Estimated body mass index is 31.64 kg/m as calculated from the following:   Height as of this encounter: 5' 2 (1.575 m).   Weight as of this encounter: 173 lb (78.5 kg).  Last encounter: 06/14/2023. Last procedure: 05/10/2023.  Reason for encounter:   History of Present Illness   Ruth Stout is a 79 year old female who presents with shoulder pain that has improved after bilateral shoulder injection  She  experienced significant worsening of shoulder pain, particularly in the bicep area, which necessitates the use of ice for relief. The pain was severe enough to disrupt her sleep, as she cannot take her prescribed pain medication, oxycodone , at night due to it causing wakefulness.  She recently received a bilateral shoulder joint injection, which provided some relief and allowed her to sleep on her right side for the first time since her shoulder issues began.  Her current medication regimen includes oxycodone , which is effective during the day but problematic at night due to its stimulating effects.       Post-Procedure Evaluation   Procedure: Glenohumeral and acromioclavicular joint Injection No.:[id number]  Laterality: Bilateral (-50)  Level: Shoulder  Target: Glenohumeral and acromioclavicular joint Location: Intra-articular  Region: Entire Shoulder Area Approach: Anterolateral approach. Type of procedure: Percutaneous joint injection   Purpose: Diagnostic/Therapeutic Indications: Shoulder pain severe enough to impact quality of life or function. Rationale (medical necessity): procedure needed and proper for the diagnosis and/or treatment of Ruth Stout's medical symptoms and needs. 1. Primary osteoarthritis of right shoulder   2. Chronic pain syndrome   3. Lumbar facet arthropathy        Pre-procedure: 9 /10        Post-procedure: 1 /10       Effectiveness:  Initial hour after procedure: 100 %  Subsequent 4-6 hours post-procedure: 100 %  Analgesia past initial 6 hours: 80 % (80% lasted for 1 week days, %0% for 2 weeks, without meds pain is 7)  Ongoing improvement Analgesic:  50% Function: Somewhat improved ROM: Somewhat improved   ROS  Constitutional: Denies any fever or chills  Gastrointestinal: No reported hemesis, hematochezia, vomiting, or acute GI distress Musculoskeletal: Bilateral shoulder pain Neurological: No reported episodes of acute onset apraxia, aphasia,  dysarthria, agnosia, amnesia, paralysis, loss of coordination, or loss of consciousness  Medication Review  DSS, amitriptyline , bisacodyl, busPIRone, fluticasone, insulin glargine, insulin lispro, losartan, naloxone , and oxyCODONE -acetaminophen   History Review  Allergy: Ruth Stout is allergic to cephalexin, clarithromycin, codeine, erythromycin base, levofloxacin in d5w, other, penicillins, and sulfa antibiotics. Drug: Ruth Stout  reports no history of drug use. Alcohol:  reports that she does not currently use alcohol. Tobacco:  reports that she has never smoked. She has never used smokeless tobacco. Social: Ruth Stout  reports that she has never smoked. She has never used smokeless tobacco. She reports that she does not currently use alcohol. She reports that she does not use drugs. Medical:  has a past medical history of Arthritis, Diabetes mellitus without complication (HCC), Diabetic neuropathy (HCC), Fibromyalgia, Hypothyroid, Insomnia, and Thyroid  disease. Surgical: Ruth Stout  has a past surgical history that includes partial hysterectomy; Appendectomy; Tonsillectomy; Wrist surgery; and tmj implants. Family: family history is not on file. She was adopted.  Laboratory Chemistry Profile   Renal Lab Results  Component Value Date   BUN 16 05/02/2020   CREATININE 0.84 05/02/2020   GFRAA >60 06/23/2019   GFRNONAA >60 05/02/2020    Hepatic Lab Results  Component Value Date   AST 18 05/02/2020   ALT 14 05/02/2020   ALBUMIN 3.3 (L) 05/02/2020   ALKPHOS 63 05/02/2020    Electrolytes Lab Results  Component Value Date   NA 132 (L) 07/12/2021   K 3.4 (L) 05/02/2020   CL 94 (L) 05/02/2020   CALCIUM 9.0 05/02/2020    Bone No results found for: VD25OH, VD125OH2TOT, CI6874NY7, CI7874NY7, 25OHVITD1, 25OHVITD2, 25OHVITD3, TESTOFREE, TESTOSTERONE  Inflammation (CRP: Acute Phase) (ESR: Chronic Phase) No results found for: CRP, ESRSEDRATE, LATICACIDVEN        Note: Above Lab results reviewed.    Physical Exam  Vitals: BP (!) 158/89   Pulse 89   Temp (!) 97.2 F (36.2 C)   Ht 5' 2 (1.575 m)   Wt 173 lb (78.5 kg)   SpO2 100%   BMI 31.64 kg/m  BMI: Estimated body mass index is 31.64 kg/m as calculated from the following:   Height as of this encounter: 5' 2 (1.575 m).   Weight as of this encounter: 173 lb (78.5 kg). Ideal: Ideal body weight: 50.1 kg (110 lb 7.2 oz) Adjusted ideal body weight: 61.4 kg (135 lb 7.5 oz) General appearance: Well nourished, well developed, and well hydrated. In no apparent acute distress Mental status: Alert, oriented x 3 (person, place, & time)       Respiratory: No evidence of acute respiratory distress Eyes: PERLA  Improvement in bilateral shoulder pain and range of motion     Assessment   Diagnosis Status  1. Chronic shoulder pain (Bilateral)   2. Osteoarthritis of shoulders (Bilateral)   3. Osteoarthritis of acromioclavicular joints (Bilateral)   4. Acromioclavicular joint pain (Bilateral)   5. Rotator cuff tear arthropathy (Right)   6. 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  Plan of Care  Problem-specific:  Assessment and Plan    Good response from bilateral glenohumeral joint injection performed by my colleague, Dr. Tanya.  Continue to monitor symptoms.  Keep follow-up appointment with Emmy Blanch, NP as scheduled for medication management.  Ruth Stout has a current medication list which includes the following long-term medication(s): amitriptyline , fluticasone, lantus solostar, insulin lispro, and losartan.  Pharmacotherapy (Medications Ordered): No orders of the defined types were placed in this encounter.  Orders:  No orders of the defined types were placed in this encounter.   Return for Keep sch. appt.    Recent Visits Date Type Provider Dept   03/21/24 Procedure visit Tanya Glisson, MD [facility]  03/05/24 Procedure visit Tanya Glisson, MD [facility]  02/21/24 Office Visit Patel, Seema K, NP [facility]  Showing recent visits within past 90 days and meeting all other requirements Today's Visits Date Type Provider Dept  05/02/24 Office Visit Marcelino Nurse, MD [facility]  Showing today's visits and meeting all other requirements Future Appointments Date Type Provider Dept  05/15/24 Appointment Patel, Seema K, NP [facility]  06/03/24 Appointment Patel, Seema K, NP [facility]  Showing future appointments within next 90 days and meeting all other requirements  I discussed the assessment and treatment plan with the patient. The patient was provided an opportunity to ask questions and all were answered. The patient agreed with the plan and demonstrated an understanding of the instructions.  Patient advised to call back or seek an in-person evaluation if the symptoms or condition worsens.  I personally spent a total of 20 minutes in the care of the patient today including preparing to see the patient, getting/reviewing separately obtained history, performing a medically appropriate exam/evaluation, counseling and educating, placing orders, and documenting clinical information in the EHR.   Note by: Nurse Marcelino, MD (TTS and AI technology used. I apologize for any typographical errors that were not detected and corrected.) Date: 05/02/2024; Time: 2:42 PM

## 2024-05-02 NOTE — Progress Notes (Signed)
 Nursing Pain Medication Assessment:  Safety precautions to be maintained throughout the outpatient stay will include: orient to surroundings, keep bed in low position, maintain call bell within reach at all times, provide assistance with transfer out of bed and ambulation.  Medication Inspection Compliance: Pill count conducted under aseptic conditions, in front of the patient. Neither the pills nor the bottle was removed from the patient's sight at any time. Once count was completed pills were immediately returned to the patient in their original bottle.  Medication: Oxycodone /APAP Pill/Patch Count: 61 of 104 pills/patches remain Pill/Patch Appearance: Markings consistent with prescribed medication Bottle Appearance: Standard pharmacy container. Clearly labeled. Filled Date: 1 / 7 / 2025 Last Medication intake:  TodaySafety precautions to be maintained throughout the outpatient stay will include: orient to surroundings, keep bed in low position, maintain call bell within reach at all times, provide assistance with transfer out of bed and ambulation.

## 2024-05-15 ENCOUNTER — Ambulatory Visit: Admitting: Nurse Practitioner

## 2024-05-15 DIAGNOSIS — M19011 Primary osteoarthritis, right shoulder: Secondary | ICD-10-CM

## 2024-05-15 DIAGNOSIS — Z91199 Patient's noncompliance with other medical treatment and regimen due to unspecified reason: Secondary | ICD-10-CM

## 2024-05-15 DIAGNOSIS — Z79891 Long term (current) use of opiate analgesic: Secondary | ICD-10-CM

## 2024-05-15 DIAGNOSIS — M12811 Other specific arthropathies, not elsewhere classified, right shoulder: Secondary | ICD-10-CM

## 2024-05-15 DIAGNOSIS — G894 Chronic pain syndrome: Secondary | ICD-10-CM

## 2024-05-15 DIAGNOSIS — Z79899 Other long term (current) drug therapy: Secondary | ICD-10-CM

## 2024-06-03 ENCOUNTER — Encounter: Admitting: Nurse Practitioner
